# Patient Record
Sex: Male | Born: 1970 | Hispanic: No | Marital: Married | Smoking: Current every day smoker
Health system: Southern US, Community
[De-identification: ages and names within clinical notes are randomized; demographics above are authoritative.]

## PROBLEM LIST (undated history)

## (undated) DIAGNOSIS — E119 Type 2 diabetes mellitus without complications: Secondary | ICD-10-CM

## (undated) DIAGNOSIS — K219 Gastro-esophageal reflux disease without esophagitis: Secondary | ICD-10-CM

## (undated) DIAGNOSIS — Z87442 Personal history of urinary calculi: Secondary | ICD-10-CM

## (undated) DIAGNOSIS — Z720 Tobacco use: Secondary | ICD-10-CM

## (undated) DIAGNOSIS — E785 Hyperlipidemia, unspecified: Secondary | ICD-10-CM

## (undated) DIAGNOSIS — F32A Depression, unspecified: Secondary | ICD-10-CM

## (undated) DIAGNOSIS — I10 Essential (primary) hypertension: Secondary | ICD-10-CM

## (undated) DIAGNOSIS — G709 Myoneural disorder, unspecified: Secondary | ICD-10-CM

## (undated) HISTORY — DX: Tobacco use: Z72.0

## (undated) HISTORY — DX: Essential (primary) hypertension: I10

## (undated) HISTORY — PX: WISDOM TOOTH EXTRACTION: SHX21

## (undated) HISTORY — DX: Hyperlipidemia, unspecified: E78.5

---

## 2015-10-07 ENCOUNTER — Other Ambulatory Visit: Payer: Self-pay | Admitting: Neurology

## 2015-10-07 DIAGNOSIS — M5412 Radiculopathy, cervical region: Secondary | ICD-10-CM

## 2015-10-13 ENCOUNTER — Ambulatory Visit
Admission: RE | Admit: 2015-10-13 | Discharge: 2015-10-13 | Disposition: A | Discharge status: Home / Self Care | Payer: Medicaid Other | Source: Ambulatory Visit | Attending: Neurology | Admitting: Neurology

## 2015-10-13 DIAGNOSIS — M5412 Radiculopathy, cervical region: Secondary | ICD-10-CM

## 2018-09-12 ENCOUNTER — Encounter: Payer: Self-pay | Admitting: Cardiology

## 2018-09-12 ENCOUNTER — Ambulatory Visit: Payer: Medicaid Other | Admitting: Cardiology

## 2018-09-12 VITALS — BP 154/85 | HR 79 | Ht 66.0 in | Wt 214.0 lb

## 2018-09-12 DIAGNOSIS — R079 Chest pain, unspecified: Secondary | ICD-10-CM | POA: Diagnosis not present

## 2018-09-12 DIAGNOSIS — R0609 Other forms of dyspnea: Secondary | ICD-10-CM | POA: Diagnosis not present

## 2018-09-12 DIAGNOSIS — I10 Essential (primary) hypertension: Secondary | ICD-10-CM | POA: Diagnosis not present

## 2018-09-12 DIAGNOSIS — Z720 Tobacco use: Secondary | ICD-10-CM | POA: Insufficient documentation

## 2018-09-12 MED ORDER — NICOTINE 14 MG/24HR TD PT24: 14.0000 mg | MEDICATED_PATCH | TRANSDERMAL | 2 refills | Status: DC

## 2018-09-12 MED ORDER — ATORVASTATIN CALCIUM 40 MG PO TABS: 40.0000 mg | ORAL_TABLET | Freq: Every day | ORAL | 3 refills | Status: DC

## 2018-09-12 MED ORDER — NITROGLYCERIN 0.4 MG SL SUBL: 0.4000 mg | SUBLINGUAL_TABLET | SUBLINGUAL | 3 refills | Status: DC | PRN

## 2018-09-12 MED ORDER — ASPIRIN 81 MG PO CHEW: 81.0000 mg | CHEWABLE_TABLET | Freq: Every day | ORAL | 3 refills | Status: AC

## 2018-09-12 MED ORDER — ISOSORBIDE MONONITRATE ER 30 MG PO TB24: 30.0000 mg | ORAL_TABLET | Freq: Every day | ORAL | 2 refills | Status: DC

## 2018-09-12 NOTE — Progress Notes (Signed)
Patient referred by Benito Mccreedy, MD for chest pain  Subjective:   Randall Washington, male    DOB: Jul 09, 1971, 48 y.o.   MRN: [id number]  48 y.o. [place] male, here for evaluation of chest pain.   Chief Complaint  Patient presents with  . Chest Pain    NP Eval  . Shortness of Breath    HPI  48 year old [place] man here with his daughter today.  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  He moved from [place] to states about 7 years ago.  He is a 30-pack-year smoker and has hypertension and hyperlipidemia.  Patient reports left-sided exertional chest pain and shortness of breath that has worsened over the last few weeks.  He also reports chest heaviness that persists for a day or 2 after his episodes of chest pain.  He also endorses palpitations.  Denies orthopnea, PND, leg edema.   Past Medical History:  Diagnosis Date  . Hyperlipidemia   . Hypertension   . Tobacco abuse      History reviewed. No pertinent surgical history.   Social History   Socioeconomic History  . Marital status: Married    Spouse name: Not on file  . Number of children: 4  . Years of education: Not on file  . Highest education level: Not on file  Occupational History  . Not on file  Social Needs  . Financial resource strain: Not on file  . Food insecurity:    Worry: Not on file    Inability: Not on file  . Transportation needs:    Medical: Not on file    Non-medical: Not on file  Tobacco Use  . Smoking status: Current Every Day Smoker    Packs/day: 1.00    Years: 30.00    Pack years: 30.00    Types: Cigarettes  . Smokeless tobacco: Never Used  Substance and Sexual Activity  . Alcohol use: Never    Frequency: Never  . Drug use: Never  . Sexual activity: Not on file  Lifestyle  . Physical activity:    Days per week: Not on file    Minutes per session: Not on file  . Stress: Not on file  Relationships  . Social connections:    Talks on phone: Not on file   Gets together: Not on file    Attends religious service: Not on file    Active member of club or organization: Not on file    Attends meetings of clubs or organizations: Not on file    Relationship status: Not on file  . Intimate partner violence:    Fear of current or ex partner: Not on file    Emotionally abused: Not on file    Physically abused: Not on file    Forced sexual activity: Not on file  Other Topics Concern  . Not on file  Social History Narrative  . Not on file     Current Outpatient Medications on File Prior to Visit  Medication Sig Dispense Refill  . fenofibrate (TRICOR) 48 MG tablet Take 1 tablet by mouth daily.    Marland Kitchen lisinopril (PRINIVIL,ZESTRIL) 20 MG tablet Take 1 tablet by mouth daily.    . meloxicam (MOBIC) 15 MG tablet Take 15 mg by mouth daily.     No current facility-administered medications on file prior to visit.     Cardiovascular studies:  EKG 09/12/2018: Sinus  Rhythm  Possible old inferior infarct  Review of Systems  Constitution:  Negative for decreased appetite, malaise/fatigue, weight gain and weight loss.  HENT: Negative for congestion.   Eyes: Negative for visual disturbance.  Cardiovascular: Positive for chest pain, dyspnea on exertion and palpitations. Negative for leg swelling and syncope.  Respiratory: Negative for shortness of breath.   Endocrine: Negative for cold intolerance.  Hematologic/Lymphatic: Does not bruise/bleed easily.  Skin: Negative for itching and rash.  Musculoskeletal: Negative for myalgias.  Gastrointestinal: Negative for abdominal pain, nausea and vomiting.  Genitourinary: Negative for dysuria.  Neurological: Negative for dizziness and weakness.  Psychiatric/Behavioral: The patient is not nervous/anxious.   All other systems reviewed and are negative.        Vitals:   09/12/18 1311  BP: (!) 154/85  Pulse: 79  SpO2: 99%    Objective:   Physical Exam  Constitutional: He is oriented to person, place,  and time. He appears well-developed and well-nourished. No distress.  HENT:  Head: Normocephalic and atraumatic.  Eyes: Pupils are equal, round, and reactive to light. Conjunctivae are normal.  Neck: No JVD present.  Cardiovascular: Normal rate, regular rhythm and intact distal pulses.  No murmur heard. Pulmonary/Chest: Effort normal and breath sounds normal. He has no wheezes. He has no rales.  Abdominal: Soft. Bowel sounds are normal. There is no rebound.  Musculoskeletal:        General: No edema.  Lymphadenopathy:    He has no cervical adenopathy.  Neurological: He is alert and oriented to person, place, and time. No cranial nerve deficit.  Skin: Skin is warm and dry.  Psychiatric: He has a normal mood and affect.  Nursing note and vitals reviewed.         Assessment & Recommendations:   48 y/o [place] man with hypertension, hyperlipidemia, tobacco abuse, strong family h/o CAD, now with exertional chest pain and dyspnea  Exertional chest pain and dyspnea Symptoms are very concerning for angina. Recommend starting aspirin 81 mg, switch fenofibrate to high intensity statin-lipitor 40 mg daily. Also started Imdur 30 mg and as needed SL NTG. Recommend exercise nuclear stress test and echocardiogram. I have asked him to avoid physical exertion until further workup.  Essential hypertension Continue lisinopril 40 mg. Imdur added. Check CBC., BMP  Tobacco abuse Tobacco cessation counseling (CPT [phone]):  - Currently smoking 1 packs/day   - Patient was informed of the dangers of tobacco abuse including stroke, cancer, and MI, as well as benefits of tobacco cessation. - Patient is willing to quit at this time. - Approximately 5 mins were spent counseling patient cessation techniques. We discussed various methods to help quit smoking, including deciding on a date to quit, joining a support group, pharmacological agents- nicotine gum/patch/lozenges, chantix. Prescribed nicotine  patch - I will reassess his progress at the next follow-up visit  Hyperlipidemia: Switched fenofibrate to lipitor. Check lipid panel  I will see him after the above tests.   Thank you for referring the patient to [place]. Please feel free to contact with any questions.  Nigel Mormon, MD [facility] Pager: [phone] Office: [phone] If no answer Cell [phone]

## 2018-09-18 ENCOUNTER — Ambulatory Visit: Payer: Medicaid Other

## 2018-09-18 DIAGNOSIS — R0789 Other chest pain: Secondary | ICD-10-CM | POA: Diagnosis not present

## 2018-09-18 DIAGNOSIS — R0609 Other forms of dyspnea: Secondary | ICD-10-CM

## 2018-09-18 DIAGNOSIS — R079 Chest pain, unspecified: Secondary | ICD-10-CM

## 2018-09-29 ENCOUNTER — Ambulatory Visit: Payer: Medicaid Other

## 2018-09-29 DIAGNOSIS — R0609 Other forms of dyspnea: Secondary | ICD-10-CM

## 2018-09-29 DIAGNOSIS — R079 Chest pain, unspecified: Secondary | ICD-10-CM

## 2018-09-29 DIAGNOSIS — R0789 Other chest pain: Secondary | ICD-10-CM

## 2018-09-30 NOTE — Progress Notes (Signed)
Patient is here for follow up visit.  Subjective:   Randall Washington, male    DOB: Mar 23, 1971, 48 y.o.   MRN: [id number]   Chief Complaint  Patient presents with  . Chest Pain  . Follow-up    Echo and nuclear stress test    HPI  48 y/o Randall Washington man with hypertension, hyperlipidemia, tobacco abuse, strong family h/o CAD,seen for exertional chest pain and dyspnea.  He was started on aspirin 81 mg,  Imdur 30 mg, switched fenofibrate to high intensity statin-lipitor 40 mg daily. Workup showed excellent exercise capacity without evidence of ischemia or infarct. Echocardiogram showed mod LVH, EF 68, grade 1 DD, mild MR.  Patient is here with his daughter. His chest pain and shortness of breath symptoms have resolved. BP is improved, but still suboptimal. He still continues to smoke about 1 cigarettes/day.   Past Medical History:  Diagnosis Date  . Hyperlipidemia   . Hypertension   . Tobacco abuse      History reviewed. No pertinent surgical history.   Social History   Socioeconomic History  . Marital status: Married    Spouse name: Not on file  . Number of children: 4  . Years of education: Not on file  . Highest education level: Not on file  Occupational History  . Not on file  Social Needs  . Financial resource strain: Not on file  . Food insecurity:    Worry: Not on file    Inability: Not on file  . Transportation needs:    Medical: Not on file    Non-medical: Not on file  Tobacco Use  . Smoking status: Current Every Day Smoker    Packs/day: 1.00    Years: 30.00    Pack years: 30.00    Types: Cigarettes  . Smokeless tobacco: Never Used  Substance and Sexual Activity  . Alcohol use: Never    Frequency: Never  . Drug use: Never  . Sexual activity: Not on file  Lifestyle  . Physical activity:    Days per week: Not on file    Minutes per session: Not on file  . Stress: Not on file  Relationships  . Social connections:    Talks on phone: Not on file     Gets together: Not on file    Attends religious service: Not on file    Active member of club or organization: Not on file    Attends meetings of clubs or organizations: Not on file    Relationship status: Not on file  . Intimate partner violence:    Fear of current or ex partner: Not on file    Emotionally abused: Not on file    Physically abused: Not on file    Forced sexual activity: Not on file  Other Topics Concern  . Not on file  Social History Narrative  . Not on file     Current Outpatient Medications on File Prior to Visit  Medication Sig Dispense Refill  . amoxicillin (AMOXIL) 500 MG tablet TAKE 1 TABLET BY MOUTH EVERY 8 HOURS FOR 10 DAYS    . aspirin (ASPIRIN CHILDRENS) 81 MG chewable tablet Chew 1 tablet (81 mg total) by mouth daily. 90 tablet 3  . isosorbide mononitrate (IMDUR) 30 MG 24 hr tablet Take 1 tablet (30 mg total) by mouth daily. 30 tablet 2  . meloxicam (MOBIC) 15 MG tablet Take 15 mg by mouth continuous as needed.     . nitroGLYCERIN (NITROSTAT)  0.4 MG SL tablet Place 1 tablet (0.4 mg total) under the tongue every 5 (five) minutes as needed for chest pain. 30 tablet 3  . predniSONE (DELTASONE) 20 MG tablet TAKE 1 TABLET BY MOUTH ONCE DAILY FOR 5 DAYS     No current facility-administered medications on file prior to visit.     Cardiovascular studies:   Leane Call Stress Test 09/29/2018 1. Resting EKG demonstrates normal sinus rhythm.  Stress EKG is negative for myocardial ischemia.  Patient exercised for 09:51 minutes and achieved 11.55 METs.  Stress terminated due to 89% of MPHR achieved.  Normal blood pressure response. 2. Perfusion imaging study demonstrates mild diaphragmatic attenuation artifact.  Right ventricle appears prominent and may suggest RVH.  There is no evidence of ischemia.  Left ventricular systolic function was calculated at 45%, but visually appears to be normal. Low risk study.  Echocardiogram 09/18/2018: Left ventricle  cavity is normal in size. Moderate concentric hypertrophy of the left ventricle. Normal global wall motion. Doppler evidence of grade I (impaired) diastolic dysfunction, normal LAP. Calculated EF 68%. Mild (Grade I) mitral regurgitation. Inadequate TR jet to estimate pulmonary artery systolic pressure. Normal right atrial pressure.    Recent labs: Results for Randall Washington (MRN [id number]) as of 10/02/2018 17:40  Ref. Range 10/01/2018 33:35  BASIC METABOLIC PANEL Unknown Rpt (A)  Sodium Latest Ref Range: 134 - 144 mmol/L 142  Potassium Latest Ref Range: 3.5 - 5.2 mmol/L 4.8  Chloride Latest Ref Range: 96 - 106 mmol/L 108 (H)  CO2 Latest Ref Range: 20 - 29 mmol/L 22  Glucose Latest Ref Range: 65 - 99 mg/dL 118 (H)  BUN Latest Ref Range: 6 - 24 mg/dL 17  Creatinine Latest Ref Range: 0.76 - 1.27 mg/dL 0.77  Calcium Latest Ref Range: 8.7 - 10.2 mg/dL 9.7  BUN/Creatinine Ratio Latest Ref Range: 9 - 20  22 (H)  GFR, Est Non African Washington Latest Ref Range: >59 mL/min/1.73 108  GFR, Est African Washington Latest Ref Range: >59 mL/min/1.73 125  Total CHOL/HDL Ratio Latest Ref Range: 0.0 - 5.0 ratio 3.0  Cholesterol, Total Latest Ref Range: 100 - 199 mg/dL 132  HDL Cholesterol Latest Ref Range: >39 mg/dL 44  LDL (calc) Latest Ref Range: 0 - 99 mg/dL 65  Triglycerides Latest Ref Range: 0 - 149 mg/dL 114  VLDL Cholesterol Cal Latest Ref Range: 5 - 40 mg/dL 23   Results for Randall Washington (MRN [id number]) as of 10/02/2018 17:40  Ref. Range 10/01/2018 37:34  BASIC METABOLIC PANEL Unknown Rpt (A)  Sodium Latest Ref Range: 134 - 144 mmol/L 142  Potassium Latest Ref Range: 3.5 - 5.2 mmol/L 4.8  Chloride Latest Ref Range: 96 - 106 mmol/L 108 (H)  CO2 Latest Ref Range: 20 - 29 mmol/L 22  Glucose Latest Ref Range: 65 - 99 mg/dL 118 (H)  BUN Latest Ref Range: 6 - 24 mg/dL 17  Creatinine Latest Ref Range: 0.76 - 1.27 mg/dL 0.77  Calcium Latest Ref Range: 8.7 - 10.2 mg/dL 9.7  BUN/Creatinine Ratio Latest  Ref Range: 9 - 20  22 (H)  GFR, Est Non African Washington Latest Ref Range: >59 mL/min/1.73 108  GFR, Est African Washington Latest Ref Range: >59 mL/min/1.73 125  Total CHOL/HDL Ratio Latest Ref Range: 0.0 - 5.0 ratio 3.0  Cholesterol, Total Latest Ref Range: 100 - 199 mg/dL 132  HDL Cholesterol Latest Ref Range: >39 mg/dL 44  LDL (calc) Latest Ref Range: 0 - 99 mg/dL 65  Triglycerides Latest  Ref Range: 0 - 149 mg/dL 114  VLDL Cholesterol Cal Latest Ref Range: 5 - 40 mg/dL 23    Review of Systems  Constitution: Negative for decreased appetite, malaise/fatigue, weight gain and weight loss.  HENT: Negative for congestion.   Eyes: Negative for visual disturbance.  Cardiovascular: Positive for chest pain (Improved) and dyspnea on exertion (Improved). Negative for claudication, leg swelling, palpitations and syncope.  Respiratory: Negative for shortness of breath.   Endocrine: Negative for cold intolerance.  Hematologic/Lymphatic: Does not bruise/bleed easily.  Skin: Negative for itching and rash.  Musculoskeletal: Negative for myalgias.  Gastrointestinal: Negative for abdominal pain, nausea and vomiting.  Genitourinary: Negative for dysuria.  Neurological: Negative for dizziness and weakness.  Psychiatric/Behavioral: The patient is not nervous/anxious.   All other systems reviewed and are negative.      Objective:    Vitals:   10/03/18 0911  BP: (!) 144/88  Pulse: 80  SpO2: 96%     Physical Exam  Constitutional: He is oriented to person, place, and time. He appears well-developed and well-nourished. No distress.  HENT:  Head: Normocephalic and atraumatic.  Eyes: Pupils are equal, round, and reactive to light. Conjunctivae are normal.  Neck: No JVD present.  Cardiovascular: Normal rate, regular rhythm and intact distal pulses.  No murmur heard. Pulmonary/Chest: Effort normal and breath sounds normal. He has no wheezes. He has no rales.  Abdominal: Soft. Bowel sounds are  normal. There is no rebound.  Musculoskeletal:        General: No edema.  Lymphadenopathy:    He has no cervical adenopathy.  Neurological: He is alert and oriented to person, place, and time. No cranial nerve deficit.  Skin: Skin is warm and dry.  Psychiatric: He has a normal mood and affect.  Nursing note and vitals reviewed.       Assessment & Recommendations:   47 y/o Randall Washington man with hypertension, hyperlipidemia, tobacco abuse, strong family h/o CAD,seen for exertional chest pain and dyspnea.  Exertional chest pain & dyspnea: Reassuring workup. It is still possible that he has mild CAD-based on his classic description of angina., but improved with medical therapy. Continue medical therapy.   Essential hypertension Improved, but remains suboptimal. Increased lisinopril to 20 mg daily.   Hyperlipidemia: Lipid panel is fairly favorable. Reduced lipitor to 20 mg daily.   Tobacco abuse counseling: - Currently smoking 3/4 packs/day   - Patient was informed of the dangers of tobacco abuse including stroke, cancer, and MI, as well as benefits of tobacco cessation. - Patient is willing to quit at this time. - Approximately 5 mins were spent counseling patient cessation techniques. We discussed various methods to help quit smoking, including deciding on a date to quit, joining a support group, pharmacological agents- nicotine gum/patch/lozenges, chantix.  I re emphasized importance of tobacco cessation. I have given him a goal of quitting by his birthday on 12/05/2018. For now, continue nicotine patch and gum, as needed. I will see him bak on 12/04/2018 to further follow up on this.  - I will reassess his progress at the next follow-up visit   Nigel Mormon, MD [facility] Pager: [phone] Office: [phone] If no answer Cell [phone]

## 2018-10-02 LAB — BASIC METABOLIC PANEL
BUN/Creatinine Ratio: 22 — ABNORMAL HIGH (ref 9–20)
BUN: 17 mg/dL (ref 6–24)
CHLORIDE: 108 mmol/L — AB (ref 96–106)
CO2: 22 mmol/L (ref 20–29)
Calcium: 9.7 mg/dL (ref 8.7–10.2)
Creatinine, Ser: 0.77 mg/dL (ref 0.76–1.27)
GFR calc Af Amer: 125 mL/min/{1.73_m2} (ref >59)
GFR, EST NON AFRICAN AMERICAN: 108 mL/min/{1.73_m2} (ref >59)
Glucose: 118 mg/dL — ABNORMAL HIGH (ref 65–99)
POTASSIUM: 4.8 mmol/L (ref 3.5–5.2)
Sodium: 142 mmol/L (ref 134–144)

## 2018-10-02 LAB — LIPID PANEL
CHOL/HDL RATIO: 3 ratio (ref 0.0–5.0)
Cholesterol, Total: 132 mg/dL (ref 100–199)
HDL: 44 mg/dL (ref >39)
LDL Calculated: 65 mg/dL (ref 0–99)
Triglycerides: 114 mg/dL (ref 0–149)
VLDL Cholesterol Cal: 23 mg/dL (ref 5–40)

## 2018-10-02 LAB — CBC
Hematocrit: 42.8 % (ref 37.5–51.0)
Hemoglobin: 14.8 g/dL (ref 13.0–17.7)
MCH: 30.7 pg (ref 26.6–33.0)
MCHC: 34.6 g/dL (ref 31.5–35.7)
MCV: 89 fL (ref 79–97)
Platelets: 201 10*3/uL (ref 150–450)
RBC: 4.82 x10E6/uL (ref 4.14–5.80)
RDW: 12.7 % (ref 11.6–15.4)
WBC: 7.2 10*3/uL (ref 3.4–10.8)

## 2018-10-03 ENCOUNTER — Other Ambulatory Visit: Payer: Self-pay

## 2018-10-03 ENCOUNTER — Encounter: Payer: Self-pay | Admitting: Cardiology

## 2018-10-03 ENCOUNTER — Ambulatory Visit: Payer: Medicaid Other | Admitting: Cardiology

## 2018-10-03 VITALS — BP 144/88 | HR 80 | Ht 66.0 in | Wt 208.4 lb

## 2018-10-03 DIAGNOSIS — Z720 Tobacco use: Secondary | ICD-10-CM | POA: Diagnosis not present

## 2018-10-03 DIAGNOSIS — I10 Essential (primary) hypertension: Secondary | ICD-10-CM | POA: Diagnosis not present

## 2018-10-03 DIAGNOSIS — R079 Chest pain, unspecified: Secondary | ICD-10-CM | POA: Diagnosis not present

## 2018-10-03 MED ORDER — LISINOPRIL 40 MG PO TABS: 40.0000 mg | ORAL_TABLET | Freq: Every day | ORAL | 3 refills | Status: DC

## 2018-10-03 MED ORDER — NICOTINE POLACRILEX 2 MG MT GUM: 2.0000 mg | CHEWING_GUM | OROMUCOSAL | 3 refills | Status: DC | PRN

## 2018-10-03 MED ORDER — NICOTINE 14 MG/24HR TD PT24: 14.0000 mg | MEDICATED_PATCH | TRANSDERMAL | 1 refills | Status: AC

## 2018-10-03 MED ORDER — ATORVASTATIN CALCIUM 20 MG PO TABS: 20.0000 mg | ORAL_TABLET | Freq: Every day | ORAL | 3 refills | Status: DC

## 2018-10-06 ENCOUNTER — Ambulatory Visit: Payer: Medicaid Other | Admitting: Cardiology

## 2018-12-04 ENCOUNTER — Ambulatory Visit: Payer: Medicaid Other | Admitting: Cardiology

## 2020-12-07 ENCOUNTER — Encounter (HOSPITAL_BASED_OUTPATIENT_CLINIC_OR_DEPARTMENT_OTHER): Payer: Self-pay | Admitting: Emergency Medicine

## 2020-12-07 ENCOUNTER — Inpatient Hospital Stay (HOSPITAL_COMMUNITY): Payer: Medicaid Other

## 2020-12-07 ENCOUNTER — Other Ambulatory Visit: Payer: Self-pay

## 2020-12-07 ENCOUNTER — Emergency Department (HOSPITAL_BASED_OUTPATIENT_CLINIC_OR_DEPARTMENT_OTHER): Payer: Medicaid Other

## 2020-12-07 ENCOUNTER — Inpatient Hospital Stay (HOSPITAL_BASED_OUTPATIENT_CLINIC_OR_DEPARTMENT_OTHER)
Admission: EM | Admit: 2020-12-07 | Discharge: 2020-12-09 | DRG: 246 | Disposition: A | Discharge status: Home / Self Care | Payer: Medicaid Other | Attending: Internal Medicine | Admitting: Internal Medicine

## 2020-12-07 ENCOUNTER — Inpatient Hospital Stay (HOSPITAL_COMMUNITY)
Admission: EM | Disposition: A | Discharge status: Home / Self Care | Payer: Self-pay | Source: Home / Self Care | Attending: Internal Medicine

## 2020-12-07 DIAGNOSIS — I2582 Chronic total occlusion of coronary artery: Secondary | ICD-10-CM | POA: Diagnosis present

## 2020-12-07 DIAGNOSIS — I2510 Atherosclerotic heart disease of native coronary artery without angina pectoris: Secondary | ICD-10-CM | POA: Diagnosis present

## 2020-12-07 DIAGNOSIS — E669 Obesity, unspecified: Secondary | ICD-10-CM | POA: Diagnosis present

## 2020-12-07 DIAGNOSIS — Z6832 Body mass index (BMI) 32.0-32.9, adult: Secondary | ICD-10-CM | POA: Diagnosis not present

## 2020-12-07 DIAGNOSIS — I249 Acute ischemic heart disease, unspecified: Principal | ICD-10-CM

## 2020-12-07 DIAGNOSIS — I10 Essential (primary) hypertension: Secondary | ICD-10-CM | POA: Diagnosis present

## 2020-12-07 DIAGNOSIS — F17210 Nicotine dependence, cigarettes, uncomplicated: Secondary | ICD-10-CM | POA: Diagnosis present

## 2020-12-07 DIAGNOSIS — M549 Dorsalgia, unspecified: Secondary | ICD-10-CM | POA: Diagnosis present

## 2020-12-07 DIAGNOSIS — G8929 Other chronic pain: Secondary | ICD-10-CM | POA: Diagnosis present

## 2020-12-07 DIAGNOSIS — I214 Non-ST elevation (NSTEMI) myocardial infarction: Principal | ICD-10-CM | POA: Diagnosis present

## 2020-12-07 DIAGNOSIS — Z8249 Family history of ischemic heart disease and other diseases of the circulatory system: Secondary | ICD-10-CM

## 2020-12-07 DIAGNOSIS — U071 COVID-19: Secondary | ICD-10-CM | POA: Diagnosis present

## 2020-12-07 DIAGNOSIS — I493 Ventricular premature depolarization: Secondary | ICD-10-CM | POA: Diagnosis present

## 2020-12-07 DIAGNOSIS — R079 Chest pain, unspecified: Secondary | ICD-10-CM

## 2020-12-07 DIAGNOSIS — E785 Hyperlipidemia, unspecified: Secondary | ICD-10-CM | POA: Diagnosis present

## 2020-12-07 HISTORY — PX: LEFT HEART CATH AND CORONARY ANGIOGRAPHY: CATH118249

## 2020-12-07 HISTORY — DX: Myoneural disorder, unspecified: G70.9

## 2020-12-07 HISTORY — DX: Depression, unspecified: F32.A

## 2020-12-07 HISTORY — DX: Gastro-esophageal reflux disease without esophagitis: K21.9

## 2020-12-07 LAB — COMPREHENSIVE METABOLIC PANEL
ALT: 30 U/L (ref 0–44)
AST: 38 U/L (ref 15–41)
Albumin: 4.4 g/dL (ref 3.5–5.0)
Alkaline Phosphatase: 52 U/L (ref 38–126)
Anion gap: 9 (ref 5–15)
BUN: 12 mg/dL (ref 6–20)
CO2: 23 mmol/L (ref 22–32)
Calcium: 8.9 mg/dL (ref 8.9–10.3)
Chloride: 105 mmol/L (ref 98–111)
Creatinine, Ser: 0.7 mg/dL (ref 0.61–1.24)
GFR, Estimated: >60 mL/min (ref >60)
Glucose, Bld: 122 mg/dL — ABNORMAL HIGH (ref 70–99)
Potassium: 4.1 mmol/L (ref 3.5–5.1)
Sodium: 137 mmol/L (ref 135–145)
Total Bilirubin: 0.5 mg/dL (ref 0.3–1.2)
Total Protein: 7.6 g/dL (ref 6.5–8.1)

## 2020-12-07 LAB — ECHOCARDIOGRAM COMPLETE
Area-P 1/2: 3.03 cm2
Height: 66 in
S' Lateral: 3.1 cm
Weight: 3184 oz

## 2020-12-07 LAB — CBC WITH DIFFERENTIAL/PLATELET
Abs Immature Granulocytes: 0.02 10*3/uL (ref 0.00–0.07)
Basophils Absolute: 0 10*3/uL (ref 0.0–0.1)
Basophils Relative: 1 %
Eosinophils Absolute: 0 10*3/uL (ref 0.0–0.5)
Eosinophils Relative: 1 %
HCT: 47.8 % (ref 39.0–52.0)
Hemoglobin: 16.9 g/dL (ref 13.0–17.0)
Immature Granulocytes: 0 %
Lymphocytes Relative: 35 %
Lymphs Abs: 2.3 10*3/uL (ref 0.7–4.0)
MCH: 30.6 pg (ref 26.0–34.0)
MCHC: 35.4 g/dL (ref 30.0–36.0)
MCV: 86.4 fL (ref 80.0–100.0)
Monocytes Absolute: 0.9 10*3/uL (ref 0.1–1.0)
Monocytes Relative: 13 %
Neutro Abs: 3.3 10*3/uL (ref 1.7–7.7)
Neutrophils Relative %: 50 %
Platelets: 173 10*3/uL (ref 150–400)
RBC: 5.53 MIL/uL (ref 4.22–5.81)
RDW: 12.1 % (ref 11.5–15.5)
WBC: 6.5 10*3/uL (ref 4.0–10.5)
nRBC: 0 % (ref 0.0–0.2)

## 2020-12-07 LAB — D-DIMER, QUANTITATIVE: D-Dimer, Quant: 0.36 ug/mL-FEU (ref 0.00–0.50)

## 2020-12-07 LAB — TROPONIN I (HIGH SENSITIVITY)
Troponin I (High Sensitivity): 891 ng/L (ref <18)
Troponin I (High Sensitivity): 3861 ng/L (ref <18)

## 2020-12-07 LAB — PROCALCITONIN: Procalcitonin: <0.1 ng/mL

## 2020-12-07 LAB — HEMOGLOBIN A1C
Hgb A1c MFr Bld: 6.1 % — ABNORMAL HIGH (ref 4.8–5.6)
Mean Plasma Glucose: 128.37 mg/dL

## 2020-12-07 LAB — LIPID PANEL
Cholesterol: 155 mg/dL (ref 0–200)
HDL: 43 mg/dL (ref >40)
LDL Cholesterol: 98 mg/dL (ref 0–99)
Total CHOL/HDL Ratio: 3.6 RATIO
Triglycerides: 69 mg/dL (ref <150)
VLDL: 14 mg/dL (ref 0–40)

## 2020-12-07 LAB — FIBRINOGEN: Fibrinogen: 322 mg/dL (ref 210–475)

## 2020-12-07 LAB — FERRITIN: Ferritin: 290 ng/mL (ref 24–336)

## 2020-12-07 LAB — LACTATE DEHYDROGENASE: LDH: 333 U/L — ABNORMAL HIGH (ref 98–192)

## 2020-12-07 LAB — HIV ANTIBODY (ROUTINE TESTING W REFLEX): HIV Screen 4th Generation wRfx: NONREACTIVE

## 2020-12-07 LAB — RESP PANEL BY RT-PCR (FLU A&B, COVID) ARPGX2
Influenza A by PCR: NEGATIVE
Influenza B by PCR: NEGATIVE
SARS Coronavirus 2 by RT PCR: POSITIVE — AB

## 2020-12-07 LAB — HEPATITIS B SURFACE ANTIGEN: Hepatitis B Surface Ag: NONREACTIVE

## 2020-12-07 LAB — HEPARIN LEVEL (UNFRACTIONATED): Heparin Unfractionated: 0.13 IU/mL — ABNORMAL LOW (ref 0.30–0.70)

## 2020-12-07 SURGERY — LEFT HEART CATH AND CORONARY ANGIOGRAPHY
Anesthesia: LOCAL

## 2020-12-07 MED ORDER — ASPIRIN 81 MG PO CHEW
324.0000 mg | CHEWABLE_TABLET | Freq: Once | ORAL | Status: AC
  Administered 2020-12-07: 324 mg via ORAL
  Filled 2020-12-07: qty 4

## 2020-12-07 MED ORDER — SODIUM CHLORIDE 0.9 % WEIGHT BASED INFUSION: 1.0000 mL/kg/h | INTRAVENOUS | Status: DC

## 2020-12-07 MED ORDER — VERAPAMIL HCL 2.5 MG/ML IV SOLN
INTRAVENOUS | Status: DC | PRN
  Administered 2020-12-07: 10 mL via INTRA_ARTERIAL

## 2020-12-07 MED ORDER — SODIUM CHLORIDE 0.9 % WEIGHT BASED INFUSION: 3.0000 mL/kg/h | INTRAVENOUS | Status: DC

## 2020-12-07 MED ORDER — LIDOCAINE HCL (PF) 1 % IJ SOLN
INTRAMUSCULAR | Status: DC | PRN
  Administered 2020-12-07: 2 mL

## 2020-12-07 MED ORDER — HEPARIN (PORCINE) IN NACL 1000-0.9 UT/500ML-% IV SOLN
INTRAVENOUS | Status: DC | PRN
  Administered 2020-12-07 (×3): 500 mL

## 2020-12-07 MED ORDER — HYDRALAZINE HCL 20 MG/ML IJ SOLN: 10.0000 mg | INTRAMUSCULAR | Status: AC | PRN

## 2020-12-07 MED ORDER — CLOPIDOGREL BISULFATE 300 MG PO TABS
ORAL_TABLET | ORAL | Status: AC
  Filled 2020-12-07: qty 1

## 2020-12-07 MED ORDER — NITROGLYCERIN IN D5W 200-5 MCG/ML-% IV SOLN
0.0000 ug/min | INTRAVENOUS | Status: DC
  Administered 2020-12-07: 5 ug/min via INTRAVENOUS
  Filled 2020-12-07: qty 250

## 2020-12-07 MED ORDER — NICOTINE 21 MG/24HR TD PT24
21.0000 mg | MEDICATED_PATCH | Freq: Every day | TRANSDERMAL | Status: DC
  Administered 2020-12-07 – 2020-12-09 (×3): 21 mg via TRANSDERMAL
  Filled 2020-12-07 (×3): qty 1

## 2020-12-07 MED ORDER — SODIUM CHLORIDE 0.9% FLUSH: 3.0000 mL | INTRAVENOUS | Status: DC | PRN

## 2020-12-07 MED ORDER — SODIUM CHLORIDE 0.9% FLUSH
3.0000 mL | Freq: Two times a day (BID) | INTRAVENOUS | Status: DC
  Administered 2020-12-09: 3 mL via INTRAVENOUS

## 2020-12-07 MED ORDER — MIDAZOLAM HCL 2 MG/2ML IJ SOLN
INTRAMUSCULAR | Status: AC
  Filled 2020-12-07: qty 2

## 2020-12-07 MED ORDER — LIDOCAINE HCL (PF) 1 % IJ SOLN
INTRAMUSCULAR | Status: AC
  Filled 2020-12-07: qty 30

## 2020-12-07 MED ORDER — FENTANYL CITRATE (PF) 100 MCG/2ML IJ SOLN
INTRAMUSCULAR | Status: AC
  Filled 2020-12-07: qty 2

## 2020-12-07 MED ORDER — SODIUM CHLORIDE 0.9 % IV SOLN: 250.0000 mL | INTRAVENOUS | Status: DC | PRN

## 2020-12-07 MED ORDER — ASPIRIN 300 MG RE SUPP
300.0000 mg | RECTAL | Status: AC
  Filled 2020-12-07: qty 1

## 2020-12-07 MED ORDER — ACETAMINOPHEN 325 MG PO TABS
650.0000 mg | ORAL_TABLET | ORAL | Status: DC | PRN
  Administered 2020-12-08: 650 mg via ORAL
  Filled 2020-12-07: qty 2

## 2020-12-07 MED ORDER — FENTANYL CITRATE (PF) 100 MCG/2ML IJ SOLN
INTRAMUSCULAR | Status: DC | PRN
  Administered 2020-12-07: 50 ug via INTRAVENOUS

## 2020-12-07 MED ORDER — TICAGRELOR 90 MG PO TABS
ORAL_TABLET | ORAL | Status: AC
  Filled 2020-12-07: qty 1

## 2020-12-07 MED ORDER — LABETALOL HCL 5 MG/ML IV SOLN: 10.0000 mg | INTRAVENOUS | Status: AC | PRN

## 2020-12-07 MED ORDER — ZOLPIDEM TARTRATE 5 MG PO TABS
5.0000 mg | ORAL_TABLET | Freq: Every evening | ORAL | Status: DC | PRN
  Administered 2020-12-07 – 2020-12-08 (×2): 5 mg via ORAL
  Filled 2020-12-07 (×2): qty 1

## 2020-12-07 MED ORDER — HEPARIN (PORCINE) 25000 UT/250ML-% IV SOLN
1550.0000 [IU]/h | INTRAVENOUS | Status: DC
  Administered 2020-12-08: 1350 [IU]/h via INTRAVENOUS
  Filled 2020-12-07: qty 250

## 2020-12-07 MED ORDER — HEPARIN BOLUS VIA INFUSION
4000.0000 [IU] | Freq: Once | INTRAVENOUS | Status: AC
  Administered 2020-12-07: 4000 [IU] via INTRAVENOUS

## 2020-12-07 MED ORDER — VERAPAMIL HCL 2.5 MG/ML IV SOLN
INTRAVENOUS | Status: AC
  Filled 2020-12-07: qty 2

## 2020-12-07 MED ORDER — ONDANSETRON HCL 4 MG/2ML IJ SOLN: 4.0000 mg | Freq: Four times a day (QID) | INTRAMUSCULAR | Status: DC | PRN

## 2020-12-07 MED ORDER — ASPIRIN EC 81 MG PO TBEC
81.0000 mg | DELAYED_RELEASE_TABLET | Freq: Every day | ORAL | Status: DC
  Administered 2020-12-08 – 2020-12-09 (×2): 81 mg via ORAL
  Filled 2020-12-07 (×2): qty 1

## 2020-12-07 MED ORDER — SODIUM CHLORIDE 0.9 % WEIGHT BASED INFUSION
3.0000 mL/kg/h | INTRAVENOUS | Status: DC
  Administered 2020-12-07: 3 mL/kg/h via INTRAVENOUS

## 2020-12-07 MED ORDER — SODIUM CHLORIDE 0.9% FLUSH
3.0000 mL | INTRAVENOUS | Status: DC | PRN
Start: 2020-12-07 — End: 2020-12-09

## 2020-12-07 MED ORDER — TIZANIDINE HCL 4 MG PO TABS
4.0000 mg | ORAL_TABLET | Freq: Three times a day (TID) | ORAL | Status: DC | PRN
  Administered 2020-12-09: 4 mg via ORAL
  Filled 2020-12-07 (×2): qty 1

## 2020-12-07 MED ORDER — IOHEXOL 350 MG/ML SOLN
INTRAVENOUS | Status: DC | PRN
  Administered 2020-12-07: 100 mL

## 2020-12-07 MED ORDER — HEPARIN (PORCINE) IN NACL 1000-0.9 UT/500ML-% IV SOLN
INTRAVENOUS | Status: AC
  Filled 2020-12-07: qty 500

## 2020-12-07 MED ORDER — VERAPAMIL HCL 2.5 MG/ML IV SOLN
INTRAVENOUS | Status: DC | PRN
  Administered 2020-12-07: 2.5 mg via INTRA_ARTERIAL

## 2020-12-07 MED ORDER — FENOFIBRATE 160 MG PO TABS
160.0000 mg | ORAL_TABLET | Freq: Every day | ORAL | Status: DC
  Administered 2020-12-08 – 2020-12-09 (×2): 160 mg via ORAL
  Filled 2020-12-07 (×2): qty 1

## 2020-12-07 MED ORDER — HEPARIN BOLUS VIA INFUSION
3000.0000 [IU] | Freq: Once | INTRAVENOUS | Status: DC
  Filled 2020-12-07: qty 3000

## 2020-12-07 MED ORDER — NITROGLYCERIN 0.4 MG SL SUBL: 0.4000 mg | SUBLINGUAL_TABLET | SUBLINGUAL | Status: DC | PRN

## 2020-12-07 MED ORDER — TICAGRELOR 90 MG PO TABS
90.0000 mg | ORAL_TABLET | Freq: Two times a day (BID) | ORAL | Status: DC
  Administered 2020-12-08 – 2020-12-09 (×3): 90 mg via ORAL
  Filled 2020-12-07 (×3): qty 1

## 2020-12-07 MED ORDER — MELOXICAM 7.5 MG PO TABS
15.0000 mg | ORAL_TABLET | Freq: Every day | ORAL | Status: DC
  Administered 2020-12-08: 15 mg via ORAL
  Filled 2020-12-07 (×2): qty 2

## 2020-12-07 MED ORDER — ACETAMINOPHEN 325 MG PO TABS: 650.0000 mg | ORAL_TABLET | ORAL | Status: DC | PRN

## 2020-12-07 MED ORDER — MIDAZOLAM HCL 2 MG/2ML IJ SOLN
INTRAMUSCULAR | Status: DC | PRN
  Administered 2020-12-07: 1 mg via INTRAVENOUS

## 2020-12-07 MED ORDER — HEPARIN (PORCINE) 25000 UT/250ML-% IV SOLN
1350.0000 [IU]/h | INTRAVENOUS | Status: DC
  Administered 2020-12-07: 1100 [IU]/h via INTRAVENOUS
  Filled 2020-12-07: qty 250

## 2020-12-07 MED ORDER — SODIUM CHLORIDE 0.9 % IV SOLN: INTRAVENOUS | Status: AC

## 2020-12-07 MED ORDER — SODIUM CHLORIDE 0.9% FLUSH
3.0000 mL | Freq: Two times a day (BID) | INTRAVENOUS | Status: DC
  Administered 2020-12-07: 3 mL via INTRAVENOUS

## 2020-12-07 MED ORDER — HEPARIN SODIUM (PORCINE) 1000 UNIT/ML IJ SOLN
INTRAMUSCULAR | Status: DC | PRN
  Administered 2020-12-07: 3000 [IU] via INTRAVENOUS
  Administered 2020-12-07: 6000 [IU] via INTRAVENOUS
  Administered 2020-12-07: 3000 [IU] via INTRAVENOUS

## 2020-12-07 MED ORDER — ALUM & MAG HYDROXIDE-SIMETH 200-200-20 MG/5ML PO SUSP
30.0000 mL | Freq: Once | ORAL | Status: AC
  Administered 2020-12-07: 30 mL via ORAL
  Filled 2020-12-07: qty 30

## 2020-12-07 MED ORDER — ASPIRIN 81 MG PO CHEW: 81.0000 mg | CHEWABLE_TABLET | ORAL | Status: DC

## 2020-12-07 MED ORDER — ASPIRIN 81 MG PO CHEW
324.0000 mg | CHEWABLE_TABLET | ORAL | Status: AC
  Administered 2020-12-07: 324 mg via ORAL
  Filled 2020-12-07: qty 4

## 2020-12-07 MED ORDER — TICAGRELOR 90 MG PO TABS
ORAL_TABLET | ORAL | Status: DC | PRN
  Administered 2020-12-07: 180 mg via ORAL

## 2020-12-07 MED ORDER — PREDNISONE 20 MG PO TABS
20.0000 mg | ORAL_TABLET | Freq: Every day | ORAL | Status: DC
  Administered 2020-12-08 – 2020-12-09 (×2): 20 mg via ORAL
  Filled 2020-12-07 (×2): qty 1

## 2020-12-07 SURGICAL SUPPLY — 20 items
CATH INFINITI 6F AL1 (CATHETERS) ×1 IMPLANT
CATH LAUNCHER 6FR EBU 3 (CATHETERS) ×1 IMPLANT
CATH LAUNCHER 6FR JL3 (CATHETERS) ×1 IMPLANT
CATH OPTITORQUE TIG 4.0 5F (CATHETERS) ×1 IMPLANT
CATH VISTA GUIDE 6FR JR4 (CATHETERS) ×1 IMPLANT
DEVICE RAD COMP TR BAND LRG (VASCULAR PRODUCTS) ×1 IMPLANT
ELECT DEFIB PAD ADLT CADENCE (PAD) ×1 IMPLANT
GLIDESHEATH SLEND A-KIT 6F 22G (SHEATH) ×1 IMPLANT
GUIDEWIRE INQWIRE 1.5J.035X260 (WIRE) IMPLANT
INQWIRE 1.5J .035X260CM (WIRE) ×2
KIT ESSENTIALS PG (KITS) ×1 IMPLANT
KIT HEART LEFT (KITS) ×2 IMPLANT
PACK CARDIAC CATHETERIZATION (CUSTOM PROCEDURE TRAY) ×2 IMPLANT
SHEATH PROBE COVER 6X72 (BAG) ×1 IMPLANT
TRANSDUCER W/STOPCOCK (MISCELLANEOUS) ×2 IMPLANT
TUBING CIL FLEX 10 FLL-RA (TUBING) ×2 IMPLANT
VALVE COPILOT STAT (MISCELLANEOUS) ×1 IMPLANT
WIRE ASAHI FIELDER XT 190CM (WIRE) ×1 IMPLANT
WIRE COUGAR XT STRL 190CM (WIRE) ×1 IMPLANT
WIRE HI TORQ WHISPER MS 190CM (WIRE) ×1 IMPLANT

## 2020-12-07 NOTE — H&P (Signed)
History and Physical    Randall Washington TDV:[id number] DOB: 04-Dec-1970 DOA: 12/07/2020  PCP: Benito Mccreedy, MD (Confirm with patient/family/NH records and if not entered, this has to be entered at [facility] point of entry) Patient coming from: Home  I have personally briefly reviewed patient's old medical records in [place]  Chief Complaint: Chest pain  HPI: Randall Washington is a 50 y.o. male with medical history significant of HTN, HLD, chronic back pain, obesity, GERD, presented with new onset of chest pain.  Patient was feeling tired 3-4 days ago, went to see PCP, who rounded COVID test and was positive.  And patient was not having any of the symptoms such as cough, fever, shortness of breath, loss of taste or diarrhea.  PCP started patient on Zithromax and prednisone 20 mg daily since yesterday.  Yesterday evening, after dinner, patient started to feel burning-like chest pain, retrosternal, localized, 8/10, denied any associated symptoms of shortness of breath sweating of feeling nausea.  Symptoms lasted about 3 to 4 hours, family gave her some Tylenol, which had minimum effect.  Patient was vaccinated for COVID x2. ED Course: Trop [phone], EKG no significant ST-T changes.  Chest x-ray no acute infiltrate.  Vital signs stable no hypoxia.  Review of Systems: As per HPI otherwise 14 point review of systems negative.    Past Medical History:  Diagnosis Date  . Depression   . GERD (gastroesophageal reflux disease)   . Hyperlipidemia   . Hypertension   . Neuromuscular disorder (HCC)    neck and shoulder pain right side down to fingers and numbess, steriod shots  . Tobacco abuse     Past Surgical History:  Procedure Laterality Date  . WISDOM TOOTH EXTRACTION       reports that he has been smoking cigarettes. He has a 30.00 pack-year smoking history. He has never used smokeless tobacco. He reports that he does not drink alcohol and does not use drugs.  No Known  Allergies  Family History  Problem Relation Age of Onset  . Heart disease Father   . Heart attack Father 4  . Heart disease Brother   . Heart disease Brother   . Heart disease Brother   . Heart disease Brother      Prior to Admission medications   Medication Sig Start Date End Date Taking? Authorizing Provider  meloxicam (MOBIC) 15 MG tablet 1 tab(s) 09/05/20  Yes [provider]  predniSONE (DELTASONE) 20 MG tablet 1 tab(s) 12/05/20  Yes [provider]  fenofibrate (TRICOR) 48 MG tablet Take 96 mg by mouth daily. 11/30/20   [provider]  ketoconazole (NIZORAL) 2 % shampoo Apply topically. 11/23/20   [provider]  losartan (COZAAR) 100 MG tablet Take 1 tablet by mouth daily. 11/29/20   [provider]  tiZANidine (ZANAFLEX) 4 MG tablet Take 4 mg by mouth every 8 (eight) hours as needed. 11/23/20   [provider]    Physical Exam: Vitals:   12/07/20 0900 12/07/20 0930 12/07/20 1000 12/07/20 1030  BP: 139/85 140/74 126/84 120/86  Pulse: 75 (!) 58 63 60  Resp: 16 16 15 16   Temp:    98 F (36.7 C)  TempSrc:    Oral  SpO2: 98% 98% 100% 100%  Weight:      Height:        Constitutional: NAD, calm, comfortable Vitals:   12/07/20 0900 12/07/20 0930 12/07/20 1000 12/07/20 1030  BP: 139/85 140/74 126/84 120/86  Pulse: 75 (!)  58 63 60  Resp: 16 16 15 16   Temp:    98 F (36.7 C)  TempSrc:    Oral  SpO2: 98% 98% 100% 100%  Weight:      Height:       Eyes: PERRL, lids and conjunctivae normal ENMT: Mucous membranes are moist. Posterior pharynx clear of any exudate or lesions.Normal dentition.  Neck: normal, supple, no masses, no thyromegaly Respiratory: clear to auscultation bilaterally, no wheezing, no crackles. Normal respiratory effort. No accessory muscle use.  Cardiovascular: Regular rate and rhythm, no murmurs / rubs / gallops. No extremity edema. 2+ pedal pulses. No carotid bruits.  Abdomen: no tenderness, no masses  palpated. No hepatosplenomegaly. Bowel sounds positive.  Musculoskeletal: no clubbing / cyanosis. No joint deformity upper and lower extremities. Good ROM, no contractures. Normal muscle tone.  Skin: no rashes, lesions, ulcers. No induration Neurologic: CN 2-12 grossly intact. Sensation intact, DTR normal. Strength 5/5 in all 4.  Psychiatric: Normal judgment and insight. Alert and oriented x 3. Normal mood.     Labs on Admission: I have personally reviewed following labs and imaging studies  CBC: Recent Labs  Lab 12/07/20 0416  WBC 6.5  NEUTROABS 3.3  HGB 16.9  HCT 47.8  MCV 86.4  PLT 485   Basic Metabolic Panel: Recent Labs  Lab 12/07/20 0416  NA 137  K 4.1  CL 105  CO2 23  GLUCOSE 122*  BUN 12  CREATININE 0.70  CALCIUM 8.9   GFR: Estimated Creatinine Clearance: 116.3 mL/min (by C-G formula based on SCr of 0.7 mg/dL). Liver Function Tests: Recent Labs  Lab 12/07/20 0416  AST 38  ALT 30  ALKPHOS 52  BILITOT 0.5  PROT 7.6  ALBUMIN 4.4   No results for input(s): LIPASE, AMYLASE in the last 168 hours. No results for input(s): AMMONIA in the last 168 hours. Coagulation Profile: No results for input(s): INR, PROTIME in the last 168 hours. Cardiac Enzymes: No results for input(s): CKTOTAL, CKMB, CKMBINDEX, TROPONINI in the last 168 hours. BNP (last 3 results) No results for input(s): PROBNP in the last 8760 hours. HbA1C: No results for input(s): HGBA1C in the last 72 hours. CBG: No results for input(s): GLUCAP in the last 168 hours. Lipid Profile: No results for input(s): CHOL, HDL, LDLCALC, TRIG, CHOLHDL, LDLDIRECT in the last 72 hours. Thyroid Function Tests: No results for input(s): TSH, T4TOTAL, FREET4, T3FREE, THYROIDAB in the last 72 hours. Anemia Panel: No results for input(s): VITAMINB12, FOLATE, FERRITIN, TIBC, IRON, RETICCTPCT in the last 72 hours. Urine analysis: No results found for: COLORURINE, APPEARANCEUR, LABSPEC, PHURINE, GLUCOSEU, HGBUR,  BILIRUBINUR, KETONESUR, PROTEINUR, UROBILINOGEN, NITRITE, LEUKOCYTESUR  Radiological Exams on Admission: DG Chest 1 View  Result Date: 12/07/2020 CLINICAL DATA:  50 year old male with chest pain. EXAM: CHEST  1 VIEW COMPARISON:  None. FINDINGS: Portable AP view at 0505 hours. Normal lung volumes and mediastinal contours. Visualized tracheal air column is within normal limits. Allowing for portable technique the lungs are clear. No pneumothorax. No osseous abnormality identified. Negative visible bowel gas pattern. IMPRESSION: Negative portable chest. Electronically Signed   By: Genevie Ann M.D.   On: 12/07/2020 05:13    EKG: Independently reviewed.  Slight ST elevation less than 0.5 mm on III and aVF.  Assessment/Plan Active Problems:   NSTEMI (non-ST elevated myocardial infarction) ([place])   COVID-19 virus infection  (please populate well all problems here in Problem List. (For example, if patient is on BP meds at home and you resume  or decide to hold them, it is a problem that needs to be her. Same for CAD, COPD, HLD and so on)  NSTEMI -Has risk factor, hyper pressure, HLD, and, active smoking. -Discussed with cardiology, who plans for cardiac cath this afternoon. -NPO. -Dual antiplatelet therapy and ACS meds, no beta blocker for borderline bradycardia.  COVID infection -No respiratory symptoms. -Check COVID markers. -D/C Zithromax  Chronic back pain -Continue short course of prednisone.  Elevated glucose -Check A1C. -May have metabolic syndrome  Obesity -Outpatient PCP follow-up  Cigarette smoke -Educated to quit -Nicotine patch  DVT prophylaxis: Heparin drip Code Status: Full Code Family Communication: Daughter at bedside Disposition Plan: Expect 1-2 days of hospital stay Consults called: Cardiology Admission status: Tele admit   Lequita Halt MD [facility] Pager [phone]  12/07/2020, 1:01 PM

## 2020-12-07 NOTE — Progress Notes (Addendum)
50 y/o [place] man with hypertension, hyperlipidemia, tobacco abuse, strong family h/o CAD, presented to [facility] with retrosternal chest pain that started after dinner on 12/06/20 night. EKG with minimal inferior ST elevation, not meeting STEMI criteria. Trop HS 800-->5000. He is also incidentally COVID positive, first noted on Sunday 12/04/20 at outside facility. Chest Xray clear. No fever.   Recommend admission to hospitalist service. Recommend Aspirin 81 mg, Crestor 40 mg, IV heparin, IV nitroglycerin. Hold losartan. Workup and management for any COVID related illness as per primary team. Recommend echocardiogram. Will plan to perform cath with COVID precautions later this afternoon.  I spoke with the patient over the phone, with the help of his sister Rovaida who assisted with interpretation.   Nigel Mormon, MD Pager: [phone] Office: [phone]

## 2020-12-07 NOTE — ED Notes (Signed)
Pt pain free at this time.  New order for nitro gtt initiated as ordered per unit RN request.

## 2020-12-07 NOTE — Progress Notes (Signed)
[place] for IV Heparin Indication: chest pain/ACS  No Known Allergies  Patient Measurements: Height: 5\' 6"  (167.6 cm) Weight: 90.3 kg (199 lb) IBW/kg (Calculated) : 63.8 Heparin Dosing Weight: 82.9 kg  Vital Signs: Temp: 98 F (36.7 C) (05/18 1030) Temp Source: Oral (05/18 1030) BP: 124/76 (05/18 1723) Pulse Rate: 58 (05/18 1723)  Labs: Recent Labs    12/07/20 0416 12/07/20 0618 12/07/20 1207  HGB 16.9  --   --   HCT 47.8  --   --   PLT 173  --   --   HEPARINUNFRC  --   --  0.13*  CREATININE 0.70  --   --   TROPONINIHS 891* 3,861*  --     Estimated Creatinine Clearance: 116.3 mL/min (by C-G formula based on SCr of 0.7 mg/dL).   Medical History: Past Medical History:  Diagnosis Date  . Depression   . GERD (gastroesophageal reflux disease)   . Hyperlipidemia   . Hypertension   . Neuromuscular disorder (HCC)    neck and shoulder pain right side down to fingers and numbess, steriod shots  . Tobacco abuse     Assessment: 50 yr old man c/o left-sided CP/burning, elevated troponins, to begin IV heparin.  Pt is S/P cardiac cath this afternoon; planned staged intervention to [facility]x tomorrow. Pharmacy is asked to resume heparin 4 hrs after TR band removal (per RN, TR band was removed at 2234 PM tonight).  H/H 16.9/47.8, plt 173  Goal of Therapy:  Heparin level 0.3-0.7 units/ml Monitor platelets by anticoagulation protocol: Yes   Plan:  Resume heparin infusion at 1350 units/hr at 0230 AM (~4 hrs after TR band removal) Check heparin level 6 hrs after restarting heparin infusion Monitor daily heparin level, CBC Monitor for bleeding F/U after staged intervention tomorrow, 5/19  Gillermina Hu, PharmD, BCPS, [facility] Clinical Pharmacist 12/07/2020 6:28 PM

## 2020-12-07 NOTE — ED Notes (Signed)
Pt placed on [facility] per EDP request

## 2020-12-07 NOTE — Consult Note (Signed)
CARDIOLOGY CONSULT NOTE  Patient IDIris Washington MRN: [id number] DOB/AGE: 50-19-1972 50 y.o.  Admit date: 12/07/2020 Referring Physician: Gerlene Fee, MD Reason for Consultation:  Chest pain  HPI:   50 y/o [place] man with hypertension, hyperlipidemia, tobacco abuse, strong family h/o CAD, with chest pain.  Patient sister Rovaida assisted with interpretation.  Patient presented to [facility] with retrosternal chest pain that started after dinner on 12/06/20 night. EKG with minimal inferior ST elevation, not meeting STEMI criteria. Trop HS 800-->5000. He is also incidentally COVID positive, first noted on Sunday 12/04/20 at outside facility. Chest Xray clear. No fever.  COVID test was checked for generalized fatigue symptoms.  Patient is currently chest pain-free.   Past Medical History:  Diagnosis Date  . Hyperlipidemia   . Hypertension   . Tobacco abuse      History reviewed. No pertinent surgical history.    Family History  Problem Relation Age of Onset  . Heart disease Father   . Heart attack Father 18  . Heart disease Brother   . Heart disease Brother   . Heart disease Brother   . Heart disease Brother      Social History: Social History   Socioeconomic History  . Marital status: Married    Spouse name: Not on file  . Number of children: 4  . Years of education: Not on file  . Highest education level: Not on file  Occupational History  . Not on file  Tobacco Use  . Smoking status: Current Every Day Smoker    Packs/day: 1.00    Years: 30.00    Pack years: 30.00    Types: Cigarettes  . Smokeless tobacco: Never Used  Vaping Use  . Vaping Use: Never used  Substance and Sexual Activity  . Alcohol use: Never  . Drug use: Never  . Sexual activity: Not on file  Other Topics Concern  . Not on file  Social History Narrative  . Not on file   Social Determinants of Health   Financial Resource Strain: Not on file  Food Insecurity: Not on  file  Transportation Needs: Not on file  Physical Activity: Not on file  Stress: Not on file  Social Connections: Not on file  Intimate Partner Violence: Not on file     Medications Prior to Admission  Medication Sig Dispense Refill Last Dose  . meloxicam (MOBIC) 15 MG tablet 1 tab(s)     . predniSONE (DELTASONE) 20 MG tablet 1 tab(s)     . fenofibrate (TRICOR) 48 MG tablet Take 96 mg by mouth daily.     Marland Kitchen ketoconazole (NIZORAL) 2 % shampoo Apply topically.     Marland Kitchen losartan (COZAAR) 100 MG tablet Take 1 tablet by mouth daily.     Marland Kitchen tiZANidine (ZANAFLEX) 4 MG tablet Take 4 mg by mouth every 8 (eight) hours as needed.       ROS    Physical Exam: Physical Exam   Labs:   Lab Results  Component Value Date   WBC 6.5 12/07/2020   HGB 16.9 12/07/2020   HCT 47.8 12/07/2020   MCV 86.4 12/07/2020   PLT 173 12/07/2020    Recent Labs  Lab 12/07/20 0416  NA 137  K 4.1  CL 105  CO2 23  BUN 12  CREATININE 0.70  CALCIUM 8.9  PROT 7.6  BILITOT 0.5  ALKPHOS 52  ALT 30  AST 38  GLUCOSE 122*    Lipid Panel  Component Value Date/Time   CHOL 132 10/01/2018 0843   TRIG 114 10/01/2018 0843   HDL 44 10/01/2018 0843   CHOLHDL 3.0 10/01/2018 0843   LDLCALC 65 10/01/2018 0843    BNP (last 3 results) N/A  HEMOGLOBIN A1C  Pending  Cardiac Panel (last 3 results) Results for [name], [name] (MRN [id number]) as of 12/07/2020 11:54  Ref. Range 12/07/2020 04:16 12/07/2020 06:18  Troponin I (High Sensitivity) Latest Ref Range: <18 ng/L 891 (HH) 3,861 (HH)    TSH N/A   Radiology: DG Chest 1 View  Result Date: 12/07/2020 CLINICAL DATA:  50 year old male with chest pain. EXAM: CHEST  1 VIEW COMPARISON:  None. FINDINGS: Portable AP view at 0505 hours. Normal lung volumes and mediastinal contours. Visualized tracheal air column is within normal limits. Allowing for portable technique the lungs are clear. No pneumothorax. No osseous abnormality identified. Negative visible bowel gas  pattern. IMPRESSION: Negative portable chest. Electronically Signed   By: Genevie Ann M.D.   On: 12/07/2020 05:13    Scheduled Meds: Continuous Infusions: . heparin 1,100 Units/hr (12/07/20 0602)  . nitroGLYCERIN 5 mcg/min (12/07/20 1017)   PRN Meds:.  CARDIAC STUDIES:  EKG 12/07/2020: Sinus rhythm 58 bpm Minimal ST elevation, inferior leads. Does not meet STEMI criteriea  Echocardiogram: Pending   Assessment & Recommendations:  50 y/o [place] man with hypertension, hyperlipidemia, tobacco abuse, strong family h/o CAD, with chest pain, COVID positive  Non-STEMI: Currently chest pain-free. HS Trop >3000 Recommend admission to hospitalist service. Recommend Aspirin 81 mg, Crestor 40 mg, IV heparin, IV nitroglycerin. Hold losartan. Workup and management for any COVID related illness as per primary team. Recommend echocardiogram. Will plan to perform cath with COVID precautions later this afternoon.      Nigel Mormon, MD Pager: [phone] Office: [phone]

## 2020-12-07 NOTE — ED Notes (Signed)
Monitor rang out nonsustained v-tach, review of rhythm strip with couplets of pvc, not sustained.  Pt awake, alert, rates pain 1/10, resting quietly.   Dr Roslynn Amble made aware and reviewed monitor strip and examined pt.  No additional orders.

## 2020-12-07 NOTE — H&P (View-Only) (Signed)
CARDIOLOGY CONSULT NOTE  Patient IDJeancarlos Washington MRN: [id number] DOB/AGE: [birth year] 50 y.o.  Admit date: 12/07/2020 Referring Physician: Gerlene Fee, MD Reason for Consultation:  Chest pain  HPI:   50 y/o Randall [place] man with hypertension, hyperlipidemia, tobacco abuse, strong family h/o CAD, with chest pain.  Patient sister Randall Washington assisted with interpretation.  Patient presented to [facility] with retrosternal chest pain that started after dinner on 12/06/20 night. EKG with minimal inferior ST elevation, not meeting STEMI criteria. Trop HS 800-->5000. He is also incidentally COVID positive, first noted on Sunday 12/04/20 at outside facility. Chest Xray clear. No fever.  COVID test was checked for generalized fatigue symptoms.  Patient is currently chest pain-free.   Past Medical History:  Diagnosis Date  . Hyperlipidemia   . Hypertension   . Tobacco abuse      History reviewed. No pertinent surgical history.    Family History  Problem Relation Age of Onset  . Heart disease Father   . Heart attack Father 52  . Heart disease Brother   . Heart disease Brother   . Heart disease Brother   . Heart disease Brother      Social History: Social History   Socioeconomic History  . Marital status: Married    Spouse name: Not on file  . Number of children: 4  . Years of education: Not on file  . Highest education level: Not on file  Occupational History  . Not on file  Tobacco Use  . Smoking status: Current Every Day Smoker    Packs/day: 1.00    Years: 30.00    Pack years: 30.00    Types: Cigarettes  . Smokeless tobacco: Never Used  Vaping Use  . Vaping Use: Never used  Substance and Sexual Activity  . Alcohol use: Never  . Drug use: Never  . Sexual activity: Not on file  Other Topics Concern  . Not on file  Social History Narrative  . Not on file   Social Determinants of Health   Financial Resource Strain: Not on file  Food Insecurity: Not on  file  Transportation Needs: Not on file  Physical Activity: Not on file  Stress: Not on file  Social Connections: Not on file  Intimate Partner Violence: Not on file     Medications Prior to Admission  Medication Sig Dispense Refill Last Dose  . meloxicam (MOBIC) 15 MG tablet 1 tab(s)     . predniSONE (DELTASONE) 20 MG tablet 1 tab(s)     . fenofibrate (TRICOR) 48 MG tablet Take 96 mg by mouth daily.     Marland Kitchen ketoconazole (NIZORAL) 2 % shampoo Apply topically.     Marland Kitchen losartan (COZAAR) 100 MG tablet Take 1 tablet by mouth daily.     Marland Kitchen tiZANidine (ZANAFLEX) 4 MG tablet Take 4 mg by mouth every 8 (eight) hours as needed.       ROS    Physical Exam: Physical Exam   Labs:   Lab Results  Component Value Date   WBC 6.5 12/07/2020   HGB 16.9 12/07/2020   HCT 47.8 12/07/2020   MCV 86.4 12/07/2020   PLT 173 12/07/2020    Recent Labs  Lab 12/07/20 0416  NA 137  K 4.1  CL 105  CO2 23  BUN 12  CREATININE 0.70  CALCIUM 8.9  PROT 7.6  BILITOT 0.5  ALKPHOS 52  ALT 30  AST 38  GLUCOSE 122*    Lipid Panel  Component Value Date/Time   CHOL 132 10/01/2018 0843   TRIG 114 10/01/2018 0843   HDL 44 10/01/2018 0843   CHOLHDL 3.0 10/01/2018 0843   LDLCALC 65 10/01/2018 0843    BNP (last 3 results) N/A  HEMOGLOBIN A1C  Pending  Cardiac Panel (last 3 results) Results for Randall, Washington (MRN [id number]) as of 12/07/2020 11:54  Ref. Range 12/07/2020 04:16 12/07/2020 06:18  Troponin I (High Sensitivity) Latest Ref Range: <18 ng/L 891 (HH) 3,861 (HH)    TSH N/A   Radiology: DG Chest 1 View  Result Date: 12/07/2020 CLINICAL DATA:  50 year old male with chest pain. EXAM: CHEST  1 VIEW COMPARISON:  None. FINDINGS: Portable AP view at 0505 hours. Normal lung volumes and mediastinal contours. Visualized tracheal air column is within normal limits. Allowing for portable technique the lungs are clear. No pneumothorax. No osseous abnormality identified. Negative visible bowel gas  pattern. IMPRESSION: Negative portable chest. Electronically Signed   By: Genevie Ann M.D.   On: 12/07/2020 05:13    Scheduled Meds: Continuous Infusions: . heparin 1,100 Units/hr (12/07/20 0602)  . nitroGLYCERIN 5 mcg/min (12/07/20 1017)   PRN Meds:.  CARDIAC STUDIES:  EKG 12/07/2020: Sinus rhythm 58 bpm Minimal ST elevation, inferior leads. Does not meet STEMI criteriea  Echocardiogram: Pending   Assessment & Recommendations:  50 y/o Randall Washington man with hypertension, hyperlipidemia, tobacco abuse, strong family h/o CAD, with chest pain, COVID positive  Non-STEMI: Currently chest pain-free. HS Trop >3000 Recommend admission to hospitalist service. Recommend Aspirin 81 mg, Crestor 40 mg, IV heparin, IV nitroglycerin. Hold losartan. Workup and management for any COVID related illness as per primary team. Recommend echocardiogram. Will plan to perform cath with COVID precautions later this afternoon.      Nigel Mormon, MD Pager: [phone] Office: [phone]

## 2020-12-07 NOTE — ED Triage Notes (Addendum)
Pt with left sided chest pain described as burning. Denies any cough, shob or nausea. Pt was dx with Covid 12/04/20

## 2020-12-07 NOTE — Progress Notes (Signed)
[place] for heparin Indication: chest pain/ACS  No Known Allergies  Patient Measurements: Height: 5\' 6"  (167.6 cm) Weight: 90.3 kg (199 lb) IBW/kg (Calculated) : 63.8 Heparin Dosing Weight: 80kg  Vital Signs: Temp: 98 F (36.7 C) (05/18 1030) Temp Source: Oral (05/18 1030) BP: 120/86 (05/18 1030) Pulse Rate: 60 (05/18 1030)  Labs: Recent Labs    12/07/20 0416 12/07/20 0618 12/07/20 1207  HGB 16.9  --   --   HCT 47.8  --   --   PLT 173  --   --   HEPARINUNFRC  --   --  0.13*  CREATININE 0.70  --   --   TROPONINIHS 891* 3,861*  --     Estimated Creatinine Clearance: 116.3 mL/min (by C-G formula based on SCr of 0.7 mg/dL).   Medical History: Past Medical History:  Diagnosis Date  . Depression   . GERD (gastroesophageal reflux disease)   . Hyperlipidemia   . Hypertension   . Neuromuscular disorder (HCC)    neck and shoulder pain right side down to fingers and numbess, steriod shots  . Tobacco abuse     Assessment: 50yo male c/o left-sided CP/burning, initial troponin elevated, to begin heparin.  Patient has now arrived at [facility], initial heparin level below goal at 0.13. No bleeding or IV issues noted. CBC within normal limits.   Goal of Therapy:  Heparin level 0.3-0.7 units/ml Monitor platelets by anticoagulation protocol: Yes   Plan:  Rebolus with 3000 units of heparin  Increase heparin to 1350 units/hr Recheck heparin level in 6 hours then daily  Erin Hearing PharmD., BCPS Clinical Pharmacist 12/07/2020 2:47 PM

## 2020-12-07 NOTE — ED Notes (Signed)
EDP requested repeat EKG

## 2020-12-07 NOTE — ED Provider Notes (Addendum)
[facility] Emergency Department Provider Note MRN:  [id number]  Arrival date & time: 12/07/20     Chief Complaint   Chest Pain   History of Present Illness   Randall Washington is a 50 y.o. year-old male with a history of hypertension presenting to the ED with chief complaint of chest pain.  Location: Central and left-sided chest Duration: 3 to 4 hours Onset: Sudden Timing: Constant Description: Burning Severity: 8 out of 10 Exacerbating/Alleviating Factors: Occurred shortly after eating Associated Symptoms: None Pertinent Negatives: Denies dizziness or diaphoresis, no nausea vomiting, no shortness of breath, no abdominal pain, no numbness or weakness to the arms or legs   Review of Systems  A complete 10 system review of systems was obtained and all systems are negative except as noted in the HPI and PMH.   Patient's Health History    Past Medical History:  Diagnosis Date  . Hyperlipidemia   . Hypertension   . Tobacco abuse     History reviewed. No pertinent surgical history.  Family History  Problem Relation Age of Onset  . Heart disease Father   . Heart attack Father 80  . Heart disease Brother   . Heart disease Brother   . Heart disease Brother   . Heart disease Brother     Social History   Socioeconomic History  . Marital status: Married    Spouse name: Not on file  . Number of children: 4  . Years of education: Not on file  . Highest education level: Not on file  Occupational History  . Not on file  Tobacco Use  . Smoking status: Current Every Day Smoker    Packs/day: 1.00    Years: 30.00    Pack years: 30.00    Types: Cigarettes  . Smokeless tobacco: Never Used  Vaping Use  . Vaping Use: Never used  Substance and Sexual Activity  . Alcohol use: Never  . Drug use: Never  . Sexual activity: Not on file  Other Topics Concern  . Not on file  Social History Narrative  . Not on file   Social Determinants of Health    Financial Resource Strain: Not on file  Food Insecurity: Not on file  Transportation Needs: Not on file  Physical Activity: Not on file  Stress: Not on file  Social Connections: Not on file  Intimate Partner Violence: Not on file     Physical Exam   Vitals:   12/07/20 0410  BP: (!) 159/99  Pulse: 68  Resp: 16  Temp: 98 F (36.7 C)  SpO2: 100%    CONSTITUTIONAL: Well-appearing, NAD NEURO:  Alert and oriented x 3, no focal deficits EYES:  eyes equal and reactive ENT/NECK:  no LAD, no JVD CARDIO: Regular rate, well-perfused, normal S1 and S2 PULM:  CTAB no wheezing or rhonchi GI/GU:  normal bowel sounds, non-distended, non-tender MSK/SPINE:  No gross deformities, no edema SKIN:  no rash, atraumatic PSYCH:  Appropriate speech and behavior  *Additional and/or pertinent findings included in MDM below  Diagnostic and Interventional Summary    EKG Interpretation  Date/Time:  Wednesday Dec 07 2020 04:17:59 EDT Ventricular Rate:  66 PR Interval:  155 QRS Duration: 110 QT Interval:  444 QTC Calculation: 466 R Axis:   14 Text Interpretation: Sinus rhythm Abnormal inferior Q waves Posterior infarct, recent ST elevation, consider inferior injury Confirmed by Gerlene Fee [phone]) on 12/07/2020 4:20:49 AM       EKG Interpretation  Date/Time:  Wednesday Dec 07 2020 05:46:43 EDT Ventricular Rate:  58 PR Interval:  146 QRS Duration: 119 QT Interval:  467 QTC Calculation: 459 R Axis:   14 Text Interpretation: Sinus rhythm Nonspecific intraventricular conduction delay Abnormal inferior Q waves Minimal ST elevation, inferior leads Confirmed by Gerlene Fee [phone]) on 12/07/2020 5:51:57 AM        Labs Reviewed  COMPREHENSIVE METABOLIC PANEL - Abnormal; Notable for the following components:      Result Value   Glucose, Bld 122 (*)    All other components within normal limits  TROPONIN I (HIGH SENSITIVITY) - Abnormal; Notable for the following components:   Troponin I  (High Sensitivity) 891 (*)    All other components within normal limits  RESP PANEL BY RT-PCR (FLU A&B, COVID) ARPGX2  CBC WITH DIFFERENTIAL/PLATELET  HEPARIN LEVEL (UNFRACTIONATED)  TROPONIN I (HIGH SENSITIVITY)    DG Chest 1 View  Final Result      Medications  heparin bolus via infusion 4,000 Units (has no administration in time range)  heparin ADULT infusion 100 units/mL (25000 units/234m) (has no administration in time range)  aspirin chewable tablet 324 mg (324 mg Oral Given 12/07/20 0426)  alum & mag hydroxide-simeth (MAALOX/MYLANTA) 200-200-20 MG/5ML suspension 30 mL (30 mLs Oral Given 12/07/20 0427)     Procedures  /  Critical Care .Critical Care Performed by: BMaudie Flakes MD Authorized by: BMaudie Flakes MD   Critical care provider statement:    Critical care time (minutes):  45   Critical care was necessary to treat or prevent imminent or life-threatening deterioration of the following conditions: Acute coronary syndrome.   Critical care was time spent personally by me on the following activities:  Discussions with consultants, evaluation of patient's response to treatment, examination of patient, ordering and performing treatments and interventions, ordering and review of laboratory studies, ordering and review of radiographic studies, pulse oximetry, re-evaluation of patient's condition, obtaining history from patient or surrogate and review of old charts    ED Course and Medical Decision Making  I have reviewed the triage vital signs, the nursing notes, and pertinent available records from the EMR.  Listed above are laboratory and imaging tests that I personally ordered, reviewed, and interpreted and then considered in my medical decision making (see below for details).  50years old, multiple cardiovascular risk factors however described as a burning pain shortly after eating and so GI etiology is favored.  Had a clean stress test per daughter 1 year ago.  EKG  is showing some nonspecific ST segment findings, STEMI criteria not met.  Unchanged with a repeat.  Will monitor closely, awaiting troponin.     First troponin over 600.  On reassessment patient's pain is largely resolved after aspirin and GI cocktail.  Repeat EKG largely unchanged.  Question ACS versus myocarditis in the setting of COVID-19, will consult cardiology.  Plan is for admission.  Starting heparin.  Spoke with Dr. PVirgina Jockof cardiology, who recommends hospitalist admission.  He will follow consultation for possible catheterization.  Dr. MNormand Sloopof hospitalist service accepts patient for admission.  Of note, patient exhibiting some ectopy, brief runs of PVCs but no sustained arrhythmia.  ZOLL placed on patient as precautionary measure.  MBarth Kirks BSedonia Small [email or web address]  Final Clinical Impressions(s) / ED Diagnoses     ICD-10-CM   1. ACS (acute coronary syndrome) (HCC)  I24.9   2. Chest pain  R07.9 DG Chest  1 View    DG Chest 1 View  3. COVID-19  U07.1     ED Discharge Orders    None       Discharge Instructions Discussed with and Provided to Patient:   Discharge Instructions   None       Maudie Flakes, MD 12/07/20 1121    Maudie Flakes, MD 12/07/20 [phone]

## 2020-12-07 NOTE — Plan of Care (Signed)
Patient and daughter educated on plan of care, tests and procedures, safety measures and precautions for covid. Decreased anxiety with increased knowledge noted for both. Results of cardiac cath and plan for tomorrow intervention discussed and patient and daughter are aware of plan for tomorrows procedure.

## 2020-12-07 NOTE — ED Notes (Signed)
Report provided to [facility] 

## 2020-12-07 NOTE — ED Notes (Signed)
EDP at bedside  

## 2020-12-07 NOTE — Progress Notes (Signed)
ANTICOAGULATION CONSULT NOTE - Initial Consult  Pharmacy Consult for heparin Indication: chest pain/ACS  No Known Allergies  Patient Measurements: Height: 5\' 6"  (167.6 cm) Weight: 90.3 kg (199 lb) IBW/kg (Calculated) : 63.8 Heparin Dosing Weight: 80kg  Vital Signs: Temp: 98 F (36.7 C) (05/18 0410) Temp Source: Oral (05/18 0410) BP: 159/99 (05/18 0410) Pulse Rate: 68 (05/18 0410)  Labs: Recent Labs    12/07/20 0416  HGB 16.9  HCT 47.8  PLT 173  CREATININE 0.70  TROPONINIHS 891*    Estimated Creatinine Clearance: 116.3 mL/min (by C-G formula based on SCr of 0.7 mg/dL).   Medical History: Past Medical History:  Diagnosis Date  . Hyperlipidemia   . Hypertension   . Tobacco abuse     Assessment: 50yo male c/o left-sided CP/burning, initial troponin elevated, to begin heparin.  Goal of Therapy:  Heparin level 0.3-0.7 units/ml Monitor platelets by anticoagulation protocol: Yes   Plan:  Heparin 4000 units IV bolus x1 followed by gtt at 1100 units/hr and monitor heparin levels and CBC.  Wynona Neat, PharmD, BCPS  12/07/2020,5:49 AM

## 2020-12-07 NOTE — Interval H&P Note (Signed)
History and Physical Interval Note:  12/07/2020 3:03 PM  Randall Washington  has presented today for surgery, with the diagnosis of nstemi.  The various methods of treatment have been discussed with the patient and family. After consideration of risks, benefits and other options for treatment, the patient has consented to  Procedure(s): LEFT HEART CATH AND CORONARY ANGIOGRAPHY (N/A) as a surgical intervention.  The patient's history has been reviewed, patient examined, no change in status, stable for surgery.  I have reviewed the patient's chart and labs.  Questions were answered to the patient's satisfaction.    2016 Appropriate Use Criteria for Coronary Revascularization in Patients With Acute Coronary Syndrome NSTEMI/UA High Risk (TIMI Score 5-7) NSTEMI/Unstable angina, stabilized patient at high risk Link Here: sistemancia.com Indication:  Revascularization by PCI or CABG of 1 or more arteries in a patient with NSTEMI or unstable angina with Stabilization after presentation High risk for clinical events  A (7) Indication: 16; Score 7   [place]

## 2020-12-07 NOTE — Progress Notes (Addendum)
  Echocardiogram 2D Echocardiogram with 3D and strain has been performed. Dr. Virgina Jock contacted to read at 1:17 pm  Hassie Bruce 12/07/2020, 1:17 PM

## 2020-12-08 ENCOUNTER — Ambulatory Visit (HOSPITAL_COMMUNITY): Admission: RE | Admit: 2020-12-08 | Payer: Medicaid Other | Source: Home / Self Care | Admitting: Cardiology

## 2020-12-08 ENCOUNTER — Encounter (HOSPITAL_COMMUNITY): Payer: Self-pay | Admitting: Cardiology

## 2020-12-08 ENCOUNTER — Other Ambulatory Visit (HOSPITAL_COMMUNITY): Payer: Self-pay

## 2020-12-08 ENCOUNTER — Inpatient Hospital Stay (HOSPITAL_COMMUNITY)
Admission: EM | Disposition: A | Discharge status: Home / Self Care | Payer: Self-pay | Source: Home / Self Care | Attending: Internal Medicine

## 2020-12-08 DIAGNOSIS — U071 COVID-19: Secondary | ICD-10-CM

## 2020-12-08 DIAGNOSIS — E785 Hyperlipidemia, unspecified: Secondary | ICD-10-CM

## 2020-12-08 DIAGNOSIS — I214 Non-ST elevation (NSTEMI) myocardial infarction: Principal | ICD-10-CM

## 2020-12-08 DIAGNOSIS — I10 Essential (primary) hypertension: Secondary | ICD-10-CM

## 2020-12-08 HISTORY — PX: CORONARY STENT INTERVENTION: CATH118234

## 2020-12-08 LAB — COMPREHENSIVE METABOLIC PANEL
ALT: 43 U/L (ref 0–44)
AST: 147 U/L — ABNORMAL HIGH (ref 15–41)
Albumin: 3.7 g/dL (ref 3.5–5.0)
Alkaline Phosphatase: 36 U/L — ABNORMAL LOW (ref 38–126)
Anion gap: 8 (ref 5–15)
BUN: 10 mg/dL (ref 6–20)
CO2: 21 mmol/L — ABNORMAL LOW (ref 22–32)
Calcium: 8.6 mg/dL — ABNORMAL LOW (ref 8.9–10.3)
Chloride: 106 mmol/L (ref 98–111)
Creatinine, Ser: 0.83 mg/dL (ref 0.61–1.24)
GFR, Estimated: >60 mL/min (ref >60)
Glucose, Bld: 130 mg/dL — ABNORMAL HIGH (ref 70–99)
Potassium: 3.8 mmol/L (ref 3.5–5.1)
Sodium: 135 mmol/L (ref 135–145)
Total Bilirubin: 0.8 mg/dL (ref 0.3–1.2)
Total Protein: 6.2 g/dL — ABNORMAL LOW (ref 6.5–8.1)

## 2020-12-08 LAB — CBC WITH DIFFERENTIAL/PLATELET
Abs Immature Granulocytes: 0.05 10*3/uL (ref 0.00–0.07)
Basophils Absolute: 0 10*3/uL (ref 0.0–0.1)
Basophils Relative: 0 %
Eosinophils Absolute: 0 10*3/uL (ref 0.0–0.5)
Eosinophils Relative: 0 %
HCT: 42.8 % (ref 39.0–52.0)
Hemoglobin: 14.6 g/dL (ref 13.0–17.0)
Immature Granulocytes: 1 %
Lymphocytes Relative: 23 %
Lymphs Abs: 1.5 10*3/uL (ref 0.7–4.0)
MCH: 30.1 pg (ref 26.0–34.0)
MCHC: 34.1 g/dL (ref 30.0–36.0)
MCV: 88.2 fL (ref 80.0–100.0)
Monocytes Absolute: 0.8 10*3/uL (ref 0.1–1.0)
Monocytes Relative: 12 %
Neutro Abs: 4.1 10*3/uL (ref 1.7–7.7)
Neutrophils Relative %: 64 %
Platelets: 157 10*3/uL (ref 150–400)
RBC: 4.85 MIL/uL (ref 4.22–5.81)
RDW: 12.4 % (ref 11.5–15.5)
WBC: 6.4 10*3/uL (ref 4.0–10.5)
nRBC: 0 % (ref 0.0–0.2)

## 2020-12-08 LAB — POCT ACTIVATED CLOTTING TIME
Activated Clotting Time: 238 seconds
Activated Clotting Time: 380 seconds
Activated Clotting Time: 696 seconds
Activated Clotting Time: 255 seconds
Activated Clotting Time: 172 seconds

## 2020-12-08 LAB — HEPARIN LEVEL (UNFRACTIONATED): Heparin Unfractionated: 0.13 IU/mL — ABNORMAL LOW (ref 0.30–0.70)

## 2020-12-08 LAB — D-DIMER, QUANTITATIVE: D-Dimer, Quant: <0.27 ug/mL-FEU (ref 0.00–0.50)

## 2020-12-08 LAB — PHOSPHORUS: Phosphorus: 3.4 mg/dL (ref 2.5–4.6)

## 2020-12-08 LAB — FERRITIN: Ferritin: 326 ng/mL (ref 24–336)

## 2020-12-08 LAB — MAGNESIUM: Magnesium: 1.8 mg/dL (ref 1.7–2.4)

## 2020-12-08 LAB — C-REACTIVE PROTEIN: CRP: 1.6 mg/dL — ABNORMAL HIGH (ref <1.0)

## 2020-12-08 SURGERY — CORONARY STENT INTERVENTION
Anesthesia: LOCAL

## 2020-12-08 MED ORDER — SODIUM CHLORIDE 0.9% FLUSH
3.0000 mL | Freq: Two times a day (BID) | INTRAVENOUS | Status: DC
  Administered 2020-12-09: 3 mL via INTRAVENOUS

## 2020-12-08 MED ORDER — HEPARIN SODIUM (PORCINE) 1000 UNIT/ML IJ SOLN
INTRAMUSCULAR | Status: DC | PRN
  Administered 2020-12-08: 10000 [IU] via INTRAVENOUS

## 2020-12-08 MED ORDER — ROSUVASTATIN CALCIUM 20 MG PO TABS
20.0000 mg | ORAL_TABLET | Freq: Every day | ORAL | 1 refills | Status: DC
  Filled 2020-12-08: qty 30, 30d supply, fill #0

## 2020-12-08 MED ORDER — SODIUM CHLORIDE 0.9 % IV SOLN: INTRAVENOUS | Status: AC

## 2020-12-08 MED ORDER — ONDANSETRON HCL 4 MG/2ML IJ SOLN: 4.0000 mg | Freq: Four times a day (QID) | INTRAMUSCULAR | Status: DC | PRN

## 2020-12-08 MED ORDER — NITROGLYCERIN 0.4 MG SL SUBL
0.4000 mg | SUBLINGUAL_TABLET | SUBLINGUAL | 3 refills | Status: AC | PRN
  Filled 2020-12-08: qty 25, 8d supply, fill #0

## 2020-12-08 MED ORDER — HYDRALAZINE HCL 20 MG/ML IJ SOLN: 10.0000 mg | INTRAMUSCULAR | Status: AC | PRN

## 2020-12-08 MED ORDER — MIDAZOLAM HCL 2 MG/2ML IJ SOLN
INTRAMUSCULAR | Status: AC
  Filled 2020-12-08: qty 2

## 2020-12-08 MED ORDER — SODIUM CHLORIDE 0.9 % WEIGHT BASED INFUSION
3.0000 mL/kg/h | INTRAVENOUS | Status: AC
  Administered 2020-12-08: 3 mL/kg/h via INTRAVENOUS

## 2020-12-08 MED ORDER — SODIUM CHLORIDE 0.9% FLUSH
3.0000 mL | Freq: Two times a day (BID) | INTRAVENOUS | Status: DC
  Administered 2020-12-08: 3 mL via INTRAVENOUS

## 2020-12-08 MED ORDER — SODIUM CHLORIDE 0.9 % IV SOLN: INTRAVENOUS | Status: DC | PRN

## 2020-12-08 MED ORDER — LIDOCAINE HCL (PF) 1 % IJ SOLN
INTRAMUSCULAR | Status: DC | PRN
  Administered 2020-12-08: 25 mL via INTRADERMAL

## 2020-12-08 MED ORDER — ACETAMINOPHEN 325 MG PO TABS
650.0000 mg | ORAL_TABLET | ORAL | Status: DC | PRN
  Filled 2020-12-08: qty 2

## 2020-12-08 MED ORDER — SODIUM CHLORIDE 0.9% FLUSH: 3.0000 mL | INTRAVENOUS | Status: DC | PRN

## 2020-12-08 MED ORDER — METHYLPREDNISOLONE SODIUM SUCC 125 MG IJ SOLR: 125.0000 mg | Freq: Once | INTRAMUSCULAR | Status: DC | PRN

## 2020-12-08 MED ORDER — MIDAZOLAM HCL 2 MG/2ML IJ SOLN
INTRAMUSCULAR | Status: DC | PRN
Start: 2020-12-08 — End: 2020-12-08
  Administered 2020-12-08: 1 mg via INTRAVENOUS

## 2020-12-08 MED ORDER — ALBUTEROL SULFATE HFA 108 (90 BASE) MCG/ACT IN AERS
2.0000 | INHALATION_SPRAY | Freq: Once | RESPIRATORY_TRACT | Status: DC | PRN
  Filled 2020-12-08: qty 6.7

## 2020-12-08 MED ORDER — SODIUM CHLORIDE 0.9 % IV SOLN: 250.0000 mL | INTRAVENOUS | Status: DC | PRN

## 2020-12-08 MED ORDER — DIPHENHYDRAMINE HCL 50 MG/ML IJ SOLN
50.0000 mg | Freq: Once | INTRAMUSCULAR | Status: DC | PRN
  Filled 2020-12-08: qty 1

## 2020-12-08 MED ORDER — FENTANYL CITRATE (PF) 100 MCG/2ML IJ SOLN
INTRAMUSCULAR | Status: AC
  Filled 2020-12-08: qty 2

## 2020-12-08 MED ORDER — EPINEPHRINE 0.3 MG/0.3ML IJ SOAJ: 0.3000 mg | Freq: Once | INTRAMUSCULAR | Status: DC | PRN

## 2020-12-08 MED ORDER — ROSUVASTATIN CALCIUM 20 MG PO TABS
40.0000 mg | ORAL_TABLET | Freq: Every day | ORAL | Status: DC
  Administered 2020-12-08 – 2020-12-09 (×2): 40 mg via ORAL
  Filled 2020-12-08 (×2): qty 2

## 2020-12-08 MED ORDER — TICAGRELOR 90 MG PO TABS
90.0000 mg | ORAL_TABLET | Freq: Two times a day (BID) | ORAL | 2 refills | Status: DC
  Filled 2020-12-08: qty 60, 30d supply, fill #0

## 2020-12-08 MED ORDER — LIDOCAINE HCL (PF) 1 % IJ SOLN
INTRAMUSCULAR | Status: AC
  Filled 2020-12-08: qty 30

## 2020-12-08 MED ORDER — SODIUM CHLORIDE 0.9 % WEIGHT BASED INFUSION: 3.0000 mL/kg/h | INTRAVENOUS | Status: DC

## 2020-12-08 MED ORDER — SODIUM CHLORIDE 0.9 % WEIGHT BASED INFUSION: 1.0000 mL/kg/h | INTRAVENOUS | Status: DC

## 2020-12-08 MED ORDER — LABETALOL HCL 5 MG/ML IV SOLN: 10.0000 mg | INTRAVENOUS | Status: AC | PRN

## 2020-12-08 MED ORDER — ENOXAPARIN SODIUM 40 MG/0.4ML IJ SOSY
40.0000 mg | PREFILLED_SYRINGE | INTRAMUSCULAR | Status: DC
  Administered 2020-12-09: 40 mg via SUBCUTANEOUS
  Filled 2020-12-08 (×2): qty 0.4

## 2020-12-08 MED ORDER — ALUM & MAG HYDROXIDE-SIMETH 200-200-20 MG/5ML PO SUSP
30.0000 mL | ORAL | Status: DC | PRN
  Filled 2020-12-08: qty 30

## 2020-12-08 MED ORDER — ASPIRIN 81 MG PO TBEC
81.0000 mg | DELAYED_RELEASE_TABLET | Freq: Every day | ORAL | 11 refills | Status: DC
  Filled 2020-12-08: qty 30, 30d supply, fill #0

## 2020-12-08 MED ORDER — METOPROLOL SUCCINATE ER 25 MG PO TB24
12.5000 mg | ORAL_TABLET | Freq: Every day | ORAL | 2 refills | Status: DC
  Filled 2020-12-08: qty 30, 60d supply, fill #0

## 2020-12-08 MED ORDER — FAMOTIDINE IN NACL 20-0.9 MG/50ML-% IV SOLN
20.0000 mg | Freq: Once | INTRAVENOUS | Status: DC | PRN
  Filled 2020-12-08: qty 50

## 2020-12-08 MED ORDER — IOHEXOL 350 MG/ML SOLN
INTRAVENOUS | Status: DC | PRN
  Administered 2020-12-08: 90 mL via INTRA_ARTERIAL

## 2020-12-08 MED ORDER — LOSARTAN POTASSIUM 100 MG PO TABS: 50.0000 mg | ORAL_TABLET | Freq: Every day | ORAL | 0 refills | Status: DC

## 2020-12-08 MED ORDER — METOPROLOL SUCCINATE 12.5 MG HALF TABLET
12.5000 mg | ORAL_TABLET | Freq: Every day | ORAL | Status: DC
  Administered 2020-12-09: 12.5 mg via ORAL
  Filled 2020-12-08 (×2): qty 1

## 2020-12-08 MED ORDER — SODIUM CHLORIDE 0.9% FLUSH
3.0000 mL | INTRAVENOUS | Status: DC | PRN
  Administered 2020-12-09: 3 mL via INTRAVENOUS

## 2020-12-08 MED ORDER — HEPARIN (PORCINE) IN NACL 1000-0.9 UT/500ML-% IV SOLN
INTRAVENOUS | Status: DC | PRN
  Administered 2020-12-08 (×2): 500 mL

## 2020-12-08 MED ORDER — NITROGLYCERIN 1 MG/10 ML FOR IR/CATH LAB
INTRA_ARTERIAL | Status: DC | PRN
  Administered 2020-12-08 (×3): 200 ug via INTRACORONARY

## 2020-12-08 MED ORDER — BEBTELOVIMAB 175 MG/2 ML IV (EUA)
175.0000 mg | Freq: Once | INTRAMUSCULAR | Status: AC
  Administered 2020-12-08: 175 mg via INTRAVENOUS
  Filled 2020-12-08: qty 2

## 2020-12-08 MED ORDER — NITROGLYCERIN 1 MG/10 ML FOR IR/CATH LAB
INTRA_ARTERIAL | Status: AC
  Filled 2020-12-08: qty 10

## 2020-12-08 MED ORDER — EPINEPHRINE 0.3 MG/0.3ML IJ SOAJ
0.3000 mg | Freq: Once | INTRAMUSCULAR | Status: DC | PRN
  Filled 2020-12-08 (×2): qty 0.6

## 2020-12-08 MED ORDER — SODIUM CHLORIDE 0.9 % WEIGHT BASED INFUSION
1.0000 mL/kg/h | INTRAVENOUS | Status: DC
  Administered 2020-12-08: 1 mL/kg/h via INTRAVENOUS

## 2020-12-08 MED ORDER — HEPARIN (PORCINE) IN NACL 1000-0.9 UT/500ML-% IV SOLN
INTRAVENOUS | Status: AC
  Filled 2020-12-08: qty 1500

## 2020-12-08 MED ORDER — HEPARIN SODIUM (PORCINE) 1000 UNIT/ML IJ SOLN
INTRAMUSCULAR | Status: AC
  Filled 2020-12-08: qty 1

## 2020-12-08 MED ORDER — FENTANYL CITRATE (PF) 100 MCG/2ML IJ SOLN
INTRAMUSCULAR | Status: DC | PRN
  Administered 2020-12-08: 50 ug via INTRAVENOUS

## 2020-12-08 MED FILL — Clopidogrel Bisulfate Tab 300 MG (Base Equiv): ORAL | Qty: 2 | Status: AC

## 2020-12-08 SURGICAL SUPPLY — 18 items
BALLN SAPPHIRE 2.5X15 (BALLOONS) ×2
BALLN SAPPHIRE 3.0X15 (BALLOONS) ×2
BALLOON SAPPHIRE 2.5X15 (BALLOONS) ×1 IMPLANT
BALLOON SAPPHIRE 3.0X15 (BALLOONS) ×1 IMPLANT
CATH LAUNCHER 6FR EBU3.5 (CATHETERS) ×2 IMPLANT
KIT ENCORE 26 ADVANTAGE (KITS) ×2 IMPLANT
KIT HEART LEFT (KITS) ×2 IMPLANT
KIT MICROPUNCTURE NIT STIFF (SHEATH) ×4 IMPLANT
PACK CARDIAC CATHETERIZATION (CUSTOM PROCEDURE TRAY) ×2 IMPLANT
SHEATH PINNACLE 6F 10CM (SHEATH) ×2 IMPLANT
SHEATH PROBE COVER 6X72 (BAG) ×2 IMPLANT
STENT RESOLUTE ONYX 2.5X15 (Permanent Stent) ×2 IMPLANT
STENT RESOLUTE ONYX 2.75X26 (Permanent Stent) ×2 IMPLANT
TRANSDUCER W/STOPCOCK (MISCELLANEOUS) ×2 IMPLANT
TUBING CIL FLEX 10 FLL-RA (TUBING) ×2 IMPLANT
VALVE COPILOT STAT (MISCELLANEOUS) ×2 IMPLANT
WIRE ASAHI PROWATER 180CM (WIRE) ×2 IMPLANT
WIRE EMERALD 3MM-J .035X150CM (WIRE) ×2 IMPLANT

## 2020-12-08 NOTE — Progress Notes (Signed)
Bebtelovimab inj given Rt AC IV. VSS. Instructed to notify RN for allergic type reactions. Verbalized understanding. Daughter into room at bedside.

## 2020-12-08 NOTE — Progress Notes (Signed)
Pharmacy COVID-19 Monoclonal Antibody Screening  Randall Washington was identified as being not hospitalized with symptoms from Covid-19 on admission. The patient may qualify for the use of monoclonal antibodies (mAB) for COVID-19 viral infection to prevent worsening symptoms stemming from Covid-19 infection.  The patient was identified based on a positive COVID-19 PCR and not requiring the use of supplemental oxygen at this time.  This patient meets the FDA criteria for Emergency Use Authorization of casirivimab/imdevimab.   Has a (+) direct SARS-CoV-2 viral test result   Is NOT hospitalized due to COVID-19   Is within 10 days of symptom onset   Has at least one of the high risk factor(s) for progression to severe COVID-19 and/or hospitalization as defined in EUA.   Specific high risk criteria: cardiovascular disease  Additionally, patient is (select one: partially vaccinated) against COVID-19. Additional testing was performed to determine if patient would be as risk for worsening infection.  Cycle time on the PCR COVID test was < 32 indicating high viral load    Due to low Cycle time, patient is eligible for therapy.  This eligibility and indication for treatment was discussed with the patient's physician:Dr. Oren Binet MD  Plan:  Based on the above discussion, it was decided that the patient will be given bebtelovimab  Erin Hearing PharmD., BCPS Clinical Pharmacist 12/08/2020 10:32 AM

## 2020-12-08 NOTE — Progress Notes (Signed)
VS remain stable. No s/s or c/o allergic reaction s/p Bebtelovimab Inj.

## 2020-12-08 NOTE — Interval H&P Note (Signed)
History and Physical Interval Note:  12/08/2020 2:37 PM  Randall Washington  has presented today for surgery, with the diagnosis of cad.  The various methods of treatment have been discussed with the patient and family. After consideration of risks, benefits and other options for treatment, the patient has consented to  Procedure(s): CORONARY STENT INTERVENTION (N/A) as a surgical intervention.  The patient's history has been reviewed, patient examined, no change in status, stable for surgery.  I have reviewed the patient's chart and labs.  Questions were answered to the patient's satisfaction.    2016 Appropriate Use Criteria for Coronary Revascularization in Patients With Acute Coronary Syndrome NSTEMI/UA High Risk (TIMI Score 5-7) NSTEMI/Unstable angina, stabilized patient at high risk Link Here: sistemancia.com Indication:  Revascularization by PCI or CABG of 1 or more arteries in a patient with NSTEMI or unstable angina with Stabilization after presentation High risk for clinical events  A (7) Indication: 16; Score 7   [place]

## 2020-12-08 NOTE — Progress Notes (Signed)
17fr sheath aspirated and removed from right femoral artery. Manual pressure applied for 20 minutes. Site level 0, no S+S of hematoma. Bedrest instructions given interpreted by family member.   Tegaderm dressing applied. Bilateral dp pulses palpable 2+, pt pulses 1+  Bedrest begins at 19:04:00

## 2020-12-08 NOTE — Plan of Care (Signed)
  Problem: Education: Goal: Knowledge of General Education information will improve Description: Including pain rating scale, medication(s)/side effects and non-pharmacologic comfort measures Outcome: Progressing   Problem: Clinical Measurements: Goal: Cardiovascular complication will be avoided Outcome: Progressing   Problem: Activity: Goal: Risk for activity intolerance will decrease Outcome: Progressing   Problem: Nutrition: Goal: Adequate nutrition will be maintained Outcome: Progressing   Problem: Pain Managment: Goal: General experience of comfort will improve Outcome: Progressing

## 2020-12-08 NOTE — Progress Notes (Signed)
[place] for heparin Indication: chest pain/ACS  No Known Allergies  Patient Measurements: Height: 5\' 6"  (167.6 cm) Weight: 90.3 kg (199 lb) IBW/kg (Calculated) : 63.8 Heparin Dosing Weight: 80kg  Vital Signs: BP: 113/67 (05/19 0453) Pulse Rate: 74 (05/19 0453)  Labs: Recent Labs    12/07/20 0416 12/07/20 0618 12/07/20 1207 12/08/20 0752  HGB 16.9  --   --  14.6  HCT 47.8  --   --  42.8  PLT 173  --   --  157  HEPARINUNFRC  --   --  0.13* 0.13*  CREATININE 0.70  --   --  0.83  TROPONINIHS 891* 3,861*  --   --     Estimated Creatinine Clearance: 112 mL/min (by C-G formula based on SCr of 0.83 mg/dL).   Medical History: Past Medical History:  Diagnosis Date  . Depression   . GERD (gastroesophageal reflux disease)   . Hyperlipidemia   . Hypertension   . Neuromuscular disorder (HCC)    neck and shoulder pain right side down to fingers and numbess, steriod shots  . Tobacco abuse     Assessment: 50yo male c/o left-sided CP/burning, initial troponin elevated, to begin heparin.  Follow up heparin level is still below goal at 0.13. No bleeding or IV issues noted. CBC within normal limits.   Goal of Therapy:  Heparin level 0.3-0.7 units/ml Monitor platelets by anticoagulation protocol: Yes   Plan:  Increase heparin to 1550 units/hr Follow up after cath  Erin Hearing PharmD., BCPS Clinical Pharmacist 12/08/2020 10:33 AM

## 2020-12-08 NOTE — TOC Benefit Eligibility Note (Signed)
Patient Teacher, English as a foreign language completed.    The patient is currently admitted and upon discharge could be taking Brilinta 90 mg.  The current 30 day co-pay is, $3.00.   The patient is insured through [place], [place] Patient Advocate Specialist [place] Team Direct Number: [phone]  Fax: [phone]

## 2020-12-08 NOTE — Progress Notes (Signed)
Subjective:  Patient underwent left heart catheterization and coronary angiography yesterday revealing 90% stenosis of the prox-mid LCx culprit lesion.  Recommendation was to plan for staged PCI later today. Echocardiogram revealed low normal LVEF at 50-55% without regional wall motion abnormalities.  Patient seen and examined at approximately 7:40 AM Patient's daughter presents at bedside and assists with interpretation.  Patient is complaining of mild chest discomfort, which he describes as "heaviness" which started yesterday following heart catheterization.  Patient denies shortness of breath.  Notably he has been up in his room walking to and from the restroom without issue and without exacerbation of chest discomfort.  Intake/Output from previous day:  I/O last 3 completed shifts: In: 85.8 [I.V.:85.8] Out: -  No intake/output data recorded.  Blood pressure 113/67, pulse 74, temperature 98 F (36.7 C), temperature source Oral, resp. rate 16, height '5\' 6"'  (1.676 m), weight 90.3 kg, SpO2 92 %. Physical Exam Vitals reviewed.  Constitutional:      Appearance: He is obese.  HENT:     Head: Normocephalic and atraumatic.  Cardiovascular:     Rate and Rhythm: Normal rate and regular rhythm.     Pulses: Intact distal pulses.     Heart sounds: S1 normal and S2 normal. No murmur heard. No gallop.   Pulmonary:     Effort: Pulmonary effort is normal. No respiratory distress.     Breath sounds: No wheezing, rhonchi or rales.  Abdominal:     General: Bowel sounds are normal.  Musculoskeletal:     Right lower leg: No edema.     Left lower leg: No edema.  Skin:    General: Skin is warm and dry.  Neurological:     General: No focal deficit present.     Mental Status: He is alert.     Lab Results: BMP BNP (last 3 results) No results for input(s): BNP in the last 8760 hours.  ProBNP (last 3 results) No results for input(s): PROBNP in the last 8760 hours. BMP Latest Ref Rng &  Units 12/07/2020 10/01/2018  Glucose 70 - 99 mg/dL 122(H) 118(H)  BUN 6 - 20 mg/dL 12 17  Creatinine 0.61 - 1.24 mg/dL 0.70 0.77  BUN/Creat Ratio 9 - 20 - 22(H)  Sodium 135 - 145 mmol/L 137 142  Potassium 3.5 - 5.1 mmol/L 4.1 4.8  Chloride 98 - 111 mmol/L 105 108(H)  CO2 22 - 32 mmol/L 23 22  Calcium 8.9 - 10.3 mg/dL 8.9 9.7   Hepatic Function Latest Ref Rng & Units 12/07/2020  Total Protein 6.5 - 8.1 g/dL 7.6  Albumin 3.5 - 5.0 g/dL 4.4  AST 15 - 41 U/L 38  ALT 0 - 44 U/L 30  Alk Phosphatase 38 - 126 U/L 52  Total Bilirubin 0.3 - 1.2 mg/dL 0.5   CBC Latest Ref Rng & Units 12/07/2020 10/01/2018  WBC 4.0 - 10.5 K/uL 6.5 7.2  Hemoglobin 13.0 - 17.0 g/dL 16.9 14.8  Hematocrit 39.0 - 52.0 % 47.8 42.8  Platelets 150 - 400 K/uL 173 201   Lipid Panel     Component Value Date/Time   CHOL 155 12/07/2020 1207   CHOL 132 10/01/2018 0843   TRIG 69 12/07/2020 1207   HDL 43 12/07/2020 1207   HDL 44 10/01/2018 0843   CHOLHDL 3.6 12/07/2020 1207   VLDL 14 12/07/2020 1207   LDLCALC 98 12/07/2020 1207   LDLCALC 65 10/01/2018 0843   Cardiac Panel (last 3 results) No results for input(s): CKTOTAL, CKMB,  TROPONINI, RELINDX in the last 72 hours.  HEMOGLOBIN A1C Lab Results  Component Value Date   HGBA1C 6.1 (H) 12/07/2020   MPG 128.37 12/07/2020   TSH No results for input(s): TSH in the last 8760 hours.  Imaging: DG Chest 1 View 12/07/2020 Normal lung volumes and mediastinal contours. Visualized tracheal air column is within normal limits. Allowing for portable technique the lungs are clear. No pneumothorax. No osseous abnormality identified. Negative visible bowel gas pattern.  IMPRESSION: Negative portable chest.    Cardiac Studies: EKG 12/07/2020: Sinus rhythm 58 bpm Minimal ST elevation, inferior leads. Does not meet STEMI criteriea  Echocardiogram 12/07/2020:  1. Left ventricular ejection fraction, by estimation, is 50 to 55%. The  left ventricle has low normal function. The  left ventricle has no regional  wall motion abnormalities. There is mild left ventricular hypertrophy.  Left ventricular diastolic  parameters are consistent with Grade I diastolic dysfunction (impaired  relaxation). The average left ventricular global longitudinal strain is  -15.7 %, which is mildly reduced.  2. Right ventricular systolic function is low normal. The right  ventricular size is normal.  3. No significant valvular abnormality.  4. The inferior vena cava is normal in size with greater than 50%  respiratory variability, suggesting right atrial pressure of 3 mmHg.  Left heart catheterization and coronary angiography 12/07/2020:  LM: Normal LAD: Minimal luminal irregularities        Diag 1 ostial 50% stenosis Ramus: Ostial 40% stenosis LCx: Prox-mid tandem 90% stenoses RCA: Mid vessel occlusion        Left-to-right collaterals frill distal RCA  Plan for staged intervention to LCx tomorrow. Resume IV heparin after TR band removal.   Scheduled Meds: . aspirin EC  81 mg Oral Daily  . fenofibrate  160 mg Oral Daily  . meloxicam  15 mg Oral Daily  . nicotine  21 mg Transdermal Daily  . predniSONE  20 mg Oral Q breakfast  . sodium chloride flush  3 mL Intravenous Q12H  . sodium chloride flush  3 mL Intravenous Q12H  . ticagrelor  90 mg Oral BID   Continuous Infusions: . sodium chloride    . sodium chloride    . sodium chloride     Followed by  . sodium chloride    . heparin 1,350 Units/hr (12/08/20 0201)  . nitroGLYCERIN 10 mcg/min (12/07/20 2156)   PRN Meds:.sodium chloride, sodium chloride, acetaminophen, nitroGLYCERIN, ondansetron (ZOFRAN) IV, sodium chloride flush, sodium chloride flush, tiZANidine, zolpidem  Assessment/Plan:   Non-STEMI: Coronary angiography yesterday revealed 90% stenosis of LCx.  Presently complains of mild chest "heaviness" this morning. We will perform repeat left heart catheterization with planned PCI to LCx today. Recommend  initiation of guideline directed medical therapy as able prior to discharge.  Will defer management of COVID-19 related illness to primary team.  Patient was seen in collaboration with Dr. Einar Gip.    Alethia Berthold, PA-C 12/08/2020, 8:13 AM Office: [phone]

## 2020-12-08 NOTE — Progress Notes (Signed)
PROGRESS NOTE                                                                                                                                                                                                             Patient Demographics:    Randall Washington, is a 50 y.o. male, DOB - 28-Sep-1970, [id number]  Outpatient Primary MD for the patient is Benito Mccreedy, MD   Admit date - 12/07/2020   LOS - 1  Chief Complaint  Patient presents with  . Chest Pain       Brief Narrative: Patient is a 50 y.o. male with PMHx of HTN, HLD, who was diagnosed with COVID-19 on 5/15 (fever/weakness)-presenting with non-STEMI.  COVID-19 vaccinated status: Vaccinated including booster  Significant Events: 5/15>> diagnosed with COVID-19 5/18>> Admit to [facility] for chest pain-found to have non-STEMI  Significant studies: 5/18>> chest x-ray: No pneumonia 5/18>> Echo: EF [id number], grade 1 diastolic dysfunction [id number] LHC: 90% proximal left circumflex lesion  COVID-19 medications: None  Antibiotics: None  Microbiology data: Blood culture:  Procedures: 5/18>> left heart catheterization: LM:  Normal LCx: Prox-mid tandem 90% stenoses RCA: Mid vessel occlusion        Left-to-right collaterals frill distal RCA LAD:  Minimal luminal irregularities  Diag 1 ostial 50% stenosis  Ramus: Ostial 40% stenosis   Consults: Cards  DVT prophylaxis: SCD's Start: 12/07/20 1801 IV heparin    Subjective:    Randall Washington today is lying comfortably in bed.  Denies any chest pain.   Assessment  & Plan :   Non-STEMI: Continue antiplatelets/statin/IV heparin-cards following for staged PCI today.  COVID-19 infection: 4-5-day history-cycle threshold 19.9.  No pneumonia but still with some URI symptoms.  Discussed MAB use with patient-he is interested-aware of risks-have asked pharmacy to dose 1 dose of MAB.  HLD: On  fenofibrate/statin.  HTN: BP reasonable-on nitroglycerin infusion-reassess to see if oral antihypertensives can be restarted post cath.  Obesity: Estimated body mass index is 32.12 kg/m as calculated from the following:   Height as of this encounter: 5\' 6"  (1.676 m).   Weight as of this encounter: 90.3 kg.   RN pressure injury documentation:   ABG: No results found for: PHART, PCO2ART, PO2ART, HCO3, TCO2, ACIDBASEDEF, O2SAT  Vent Settings: N/A    Condition - Stable  Family Communication  : Daughter at  bedside.  Code Status :  Full Code  Diet :  Diet Order            Diet NPO time specified  Diet effective ____           Diet Heart Room service appropriate? Yes; Fluid consistency: Thin  Diet effective now                  Disposition Plan  :   Status is: Inpatient  Remains inpatient appropriate because:Inpatient level of care appropriate due to severity of illness   Dispo: The patient is from: Home              Anticipated d/c is to: Home              Patient currently is not medically stable to d/c.   Difficult to place patient No    Barriers to discharge: Non-STEMI-for PCI later today-potential discharge on 5/20.  Antimicorbials  :    Anti-infectives (From admission, onward)   None      Inpatient Medications  Scheduled Meds: . aspirin EC  81 mg Oral Daily  . fenofibrate  160 mg Oral Daily  . meloxicam  15 mg Oral Daily  . nicotine  21 mg Transdermal Daily  . predniSONE  20 mg Oral Q breakfast  . rosuvastatin  40 mg Oral Daily  . sodium chloride flush  3 mL Intravenous Q12H  . sodium chloride flush  3 mL Intravenous Q12H  . ticagrelor  90 mg Oral BID   Continuous Infusions: . sodium chloride    . sodium chloride    . sodium chloride    . heparin 1,350 Units/hr (12/08/20 0201)  . nitroGLYCERIN 10 mcg/min (12/07/20 2156)   PRN Meds:.sodium chloride, sodium chloride, acetaminophen, nitroGLYCERIN, ondansetron (ZOFRAN) IV, sodium chloride  flush, sodium chloride flush, tiZANidine, zolpidem   Time Spent in minutes  35    See all Orders from today for further details   Oren Binet M.D on 12/08/2020 at 10:09 AM  To page go to [email or web address] - use universal password  [facility] -  Office  [phone]    Objective:   Vitals:   12/07/20 2122 12/07/20 2152 12/07/20 2222 12/08/20 0453  BP: 128/73 123/77 119/80 113/67  Pulse: 74 70 61 74  Resp:   15 16  Temp:      TempSrc:      SpO2: 94% 96% 98% 92%  Weight:      Height:        Wt Readings from Last 3 Encounters:  12/07/20 90.3 kg  10/03/18 94.5 kg  09/12/18 97.1 kg     Intake/Output Summary (Last 24 hours) at 12/08/2020 1009 Last data filed at 12/08/2020 0600 Gross per 24 hour  Intake 85.82 ml  Output --  Net 85.82 ml     Physical Exam Gen Exam:Alert awake-not in any distress HEENT:atraumatic, normocephalic Chest: B/L clear to auscultation anteriorly CVS:S1S2 regular Abdomen:soft non tender, non distended Extremities:no edema Neurology: Non focal Skin: no rash   Data Review:    CBC Recent Labs  Lab 12/07/20 0416 12/08/20 0752  WBC 6.5 6.4  HGB 16.9 14.6  HCT 47.8 42.8  PLT 173 157  MCV 86.4 88.2  MCH 30.6 30.1  MCHC 35.4 34.1  RDW 12.1 12.4  LYMPHSABS 2.3 1.5  MONOABS 0.9 0.8  EOSABS 0.0 0.0  BASOSABS 0.0 0.0    Chemistries  Recent Labs  Lab 12/07/20 0416 12/08/20 0752  NA  137 135  K 4.1 3.8  CL 105 106  CO2 23 21*  GLUCOSE 122* 130*  BUN 12 10  CREATININE 0.70 0.83  CALCIUM 8.9 8.6*  MG  --  1.8  AST 38 147*  ALT 30 43  ALKPHOS 52 36*  BILITOT 0.5 0.8   ------------------------------------------------------------------------------------------------------------------ Recent Labs    12/07/20 1207  CHOL 155  HDL 43  LDLCALC 98  TRIG 69  CHOLHDL 3.6    Lab Results  Component Value Date   HGBA1C 6.1 (H) 12/07/2020    ------------------------------------------------------------------------------------------------------------------ No results for input(s): TSH, T4TOTAL, T3FREE, THYROIDAB in the last 72 hours.  Invalid input(s): FREET3 ------------------------------------------------------------------------------------------------------------------ Recent Labs    12/07/20 1337 12/08/20 0752  FERRITIN 290 326    Coagulation profile No results for input(s): INR, PROTIME in the last 168 hours.  Recent Labs    12/07/20 1337 12/08/20 0752  DDIMER 0.36 <0.27    Cardiac Enzymes No results for input(s): CKMB, TROPONINI, MYOGLOBIN in the last 168 hours.  Invalid input(s): CK ------------------------------------------------------------------------------------------------------------------ No results found for: BNP  Micro Results Recent Results (from the past 240 hour(s))  Resp Panel by RT-PCR (Flu A&B, Covid) Nasopharyngeal Swab     Status: Abnormal   Collection Time: 12/07/20  6:01 AM   Specimen: Nasopharyngeal Swab; Nasopharyngeal(NP) swabs in vial transport medium  Result Value Ref Range Status   SARS Coronavirus 2 by RT PCR POSITIVE (A) NEGATIVE Final    Comment: RESULT CALLED TO, READ BACK BY AND VERIFIED WITH: KELLIE NEAL RN ON 12/07/20 AT 8676 [place] (NOTE) SARS-CoV-2 target nucleic acids are DETECTED.  The SARS-CoV-2 RNA is generally detectable in upper respiratory specimens during the acute phase of infection. Positive results are indicative of the presence of the identified virus, but do not rule out bacterial infection or co-infection with other pathogens not detected by the test. Clinical correlation with patient history and other diagnostic information is necessary to determine patient infection status. The expected result is Negative.  Fact Sheet for Patients: EntrepreneurPulse.com.au  Fact Sheet for Healthcare  Providers: IncredibleEmployment.be  This test is not yet approved or cleared by the [place] FDA and  has been authorized for detection and/or diagnosis of SARS-CoV-2 by FDA under an Emergency Use Authorization (EUA).  This EUA will remain in effect (meaning this test ca n be used) for the duration of  the COVID-19 declaration under Section 564(b)(1) of the Act, 21 U.S.C. section 360bbb-3(b)(1), unless the authorization is terminated or revoked sooner.     Influenza A by PCR NEGATIVE NEGATIVE Final   Influenza B by PCR NEGATIVE NEGATIVE Final    Comment: (NOTE) The Xpert Xpress SARS-CoV-2/FLU/RSV plus assay is intended as an aid in the diagnosis of influenza from Nasopharyngeal swab specimens and should not be used as a sole basis for treatment. Nasal washings and aspirates are unacceptable for Xpert Xpress SARS-CoV-2/FLU/RSV testing.  Fact Sheet for Patients: EntrepreneurPulse.com.au  Fact Sheet for Healthcare Providers: IncredibleEmployment.be  This test is not yet approved or cleared by the [place] FDA and has been authorized for detection and/or diagnosis of SARS-CoV-2 by FDA under an Emergency Use Authorization (EUA). This EUA will remain in effect (meaning this test can be used) for the duration of the COVID-19 declaration under Section 564(b)(1) of the Act, 21 U.S.C. section 360bbb-3(b)(1), unless the authorization is terminated or revoked.  Performed at [facility], [address], [place], [place] [address]     Radiology Reports DG Chest 1  View  Result Date: 12/07/2020 CLINICAL DATA:  50 year old male with chest pain. EXAM: CHEST  1 VIEW COMPARISON:  None. FINDINGS: Portable AP view at 0505 hours. Normal lung volumes and mediastinal contours. Visualized tracheal air column is within normal limits. Allowing for portable technique the lungs are clear. No pneumothorax. No osseous abnormality  identified. Negative visible bowel gas pattern. IMPRESSION: Negative portable chest. Electronically Signed   By: Genevie Ann M.D.   On: 12/07/2020 05:13   CARDIAC CATHETERIZATION  Result Date: 12/07/2020 LM: Normal LAD: Minimal luminal irregularities        Diag 1 ostial 50% stenosis Ramus: Ostial 40% stenosis LCx: Prox-mid tandem 90% stenoses RCA: Mid vessel occlusion        Left-to-right collaterals frill distal RCA Plan for staged intervention to LCx tomorrow. Resume IV heparin after TR band removal. Nigel Mormon, MD Pager: [phone] Office: [phone]   ECHOCARDIOGRAM COMPLETE  Result Date: 12/07/2020    ECHOCARDIOGRAM REPORT   Patient Name:   Randall Washington Date of Exam: 12/07/2020 Medical Rec #:  [id number]    Height:       66.0 in Accession #:    [id number]   Weight:       199.0 lb Date of Birth:  [birth year]    BSA:          1.996 m Patient Age:    34 years     BP:           120/86 mmHg Patient Gender: M            HR:           60 bpm. Exam Location:  Inpatient Procedure: 2D Echo, 3D Echo, Color Doppler, Cardiac Doppler and Strain Analysis Indications:     Chest Pain R07.9  History:         Patient has no prior history of Echocardiogram examinations.                  Risk Factors:Hypertension, Current Smoker and Dyslipidemia.                  COVID 19. GERD.  Sonographer:     Darlina Sicilian RDCS Referring Phys:  [id number] [facility] J PATWARDHAN Diagnosing Phys: Vernell Leep MD  Sonographer Comments: Suboptimal parasternal window. IMPRESSIONS  1. Left ventricular ejection fraction, by estimation, is 50 to 55%. The left ventricle has low normal function. The left ventricle has no regional wall motion abnormalities. There is mild left ventricular hypertrophy. Left ventricular diastolic parameters are consistent with Grade I diastolic dysfunction (impaired relaxation). The average left ventricular global longitudinal strain is -15.7 %, which is mildly reduced.  2. Right ventricular systolic function  is low normal. The right ventricular size is normal.  3. No significant valvular abnormality.  4. The inferior vena cava is normal in size with greater than 50% respiratory variability, suggesting right atrial pressure of 3 mmHg. FINDINGS  Left Ventricle: Left ventricular ejection fraction, by estimation, is 50 to 55%. The left ventricle has low normal function. The left ventricle has no regional wall motion abnormalities. The average left ventricular global longitudinal strain is -15.7 %. The global longitudinal strain is abnormal. The left ventricular internal cavity size was normal in size. There is mild left ventricular hypertrophy. Left ventricular diastolic parameters are consistent with Grade I diastolic dysfunction (impaired relaxation). Right Ventricle: The right ventricular size is normal. No increase in right ventricular wall thickness. Right ventricular systolic function is low normal. Left  Atrium: Left atrial size was normal in size. Right Atrium: Right atrial size was normal in size. Pericardium: There is no evidence of pericardial effusion. Mitral Valve: The mitral valve is grossly normal. No evidence of mitral valve regurgitation. Tricuspid Valve: The tricuspid valve is grossly normal. Tricuspid valve regurgitation is not demonstrated. Aortic Valve: The aortic valve is grossly normal. Aortic valve regurgitation is not visualized. Pulmonic Valve: The pulmonic valve was grossly normal. Pulmonic valve regurgitation is not visualized. Aorta: The aortic root and ascending aorta are structurally normal, with no evidence of dilitation. Venous: The inferior vena cava is normal in size with greater than 50% respiratory variability, suggesting right atrial pressure of 3 mmHg. IAS/Shunts: No atrial level shunt detected by color flow Doppler.  LEFT VENTRICLE PLAX 2D LVIDd:         4.50 cm  Diastology LVIDs:         3.10 cm  LV e' medial:    5.44 cm/s LV PW:         1.10 cm  LV E/e' medial:  13.3 LV IVS:         1.40 cm  LV e' lateral:   12.60 cm/s LVOT diam:     2.30 cm  LV E/e' lateral: 5.7 LV SV:         66 LV SV Index:   33       2D Longitudinal Strain LVOT Area:     4.15 cm 2D Strain GLS Avg:     -15.7 %  RIGHT VENTRICLE RV S prime:     8.16 cm/s TAPSE (M-mode): 1.3 cm LEFT ATRIUM             Index       RIGHT ATRIUM           Index LA diam:        3.30 cm 1.65 cm/m  RA Area:     10.80 cm LA Vol (A2C):   46.2 ml 23.15 ml/m RA Volume:   21.20 ml  10.62 ml/m LA Vol (A4C):   37.5 ml 18.79 ml/m LA Biplane Vol: 42.6 ml 21.35 ml/m  AORTIC VALVE LVOT Vmax:   82.20 cm/s LVOT Vmean:  60.100 cm/s LVOT VTI:    0.160 m  AORTA Ao Root diam: 3.40 cm MITRAL VALVE MV Area (PHT): 3.03 cm    SHUNTS MV Decel Time: 250 msec    Systemic VTI:  0.16 m MV E velocity: 72.40 cm/s  Systemic Diam: 2.30 cm MV A velocity: 69.00 cm/s MV E/A ratio:  1.05 Manish Patwardhan MD Electronically signed by Vernell Leep MD Signature Date/Time: 12/07/2020/2:05:22 PM    Final

## 2020-12-09 ENCOUNTER — Encounter (HOSPITAL_COMMUNITY): Payer: Self-pay | Admitting: Cardiology

## 2020-12-09 ENCOUNTER — Other Ambulatory Visit (HOSPITAL_COMMUNITY): Payer: Self-pay

## 2020-12-09 LAB — COMPREHENSIVE METABOLIC PANEL
ALT: 33 U/L (ref 0–44)
AST: 78 U/L — ABNORMAL HIGH (ref 15–41)
Albumin: 3.4 g/dL — ABNORMAL LOW (ref 3.5–5.0)
Alkaline Phosphatase: 35 U/L — ABNORMAL LOW (ref 38–126)
Anion gap: 8 (ref 5–15)
BUN: 9 mg/dL (ref 6–20)
CO2: 23 mmol/L (ref 22–32)
Calcium: 8.6 mg/dL — ABNORMAL LOW (ref 8.9–10.3)
Chloride: 105 mmol/L (ref 98–111)
Creatinine, Ser: 0.86 mg/dL (ref 0.61–1.24)
GFR, Estimated: >60 mL/min (ref >60)
Glucose, Bld: 143 mg/dL — ABNORMAL HIGH (ref 70–99)
Potassium: 3.5 mmol/L (ref 3.5–5.1)
Sodium: 136 mmol/L (ref 135–145)
Total Bilirubin: 0.4 mg/dL (ref 0.3–1.2)
Total Protein: 5.8 g/dL — ABNORMAL LOW (ref 6.5–8.1)

## 2020-12-09 LAB — CBC
HCT: 40.5 % (ref 39.0–52.0)
Hemoglobin: 14 g/dL (ref 13.0–17.0)
MCH: 30.4 pg (ref 26.0–34.0)
MCHC: 34.6 g/dL (ref 30.0–36.0)
MCV: 87.9 fL (ref 80.0–100.0)
Platelets: 151 10*3/uL (ref 150–400)
RBC: 4.61 MIL/uL (ref 4.22–5.81)
RDW: 12.4 % (ref 11.5–15.5)
WBC: 6.2 10*3/uL (ref 4.0–10.5)
nRBC: 0 % (ref 0.0–0.2)

## 2020-12-09 LAB — C-REACTIVE PROTEIN: CRP: 4.8 mg/dL — ABNORMAL HIGH (ref <1.0)

## 2020-12-09 MED ORDER — LOSARTAN POTASSIUM 50 MG PO TABS
50.0000 mg | ORAL_TABLET | Freq: Every day | ORAL | 0 refills | Status: DC
  Filled 2020-12-09: qty 60, 60d supply, fill #0

## 2020-12-09 MED FILL — Heparin Sod (Porcine)-NaCl IV Soln 1000 Unit/500ML-0.9%: INTRAVENOUS | Qty: 500 | Status: AC

## 2020-12-09 NOTE — Progress Notes (Signed)
CARDIAC REHAB PHASE I   Did not see pt face-to-face due to Covid restrictions. Left MI book, stent card, heart healthy diet and smoking cessation sheet. Attempted to call room, room phone busy. Phase 2 referral in place, will send to [facility] [place].  Rufina Falco, RN BSN 12/09/2020 9:03 AM

## 2020-12-09 NOTE — Discharge Summary (Signed)
PATIENT DETAILS Name: Randall Washington Age: 50 y.o. Sex: male Date of Birth: 1971-03-22 MRN: [id number]. Admitting Physician: Emeline GeneralPing T Zhang, MD WUJ:[id number]:Osei-Bonsu, Greggory StallionGeorge, MD  Admit Date: 12/07/2020 Discharge date: 12/09/2020  Recommendations for Outpatient Follow-up:  1. Follow up with PCP in 1-2 weeks 2. Please obtain CMP/CBC in one week   Admitted From:  Home   Disposition: Home   Home Health: No  Equipment/Devices: None  Discharge Condition: Stable  CODE STATUS: FULL CODE  Diet recommendation:  Diet Order            Diet - low sodium heart healthy           Diet Heart Room service appropriate? Yes; Fluid consistency: Thin  Diet effective now                  Brief Narrative: Patient is a 50 y.o. male with PMHx of HTN, HLD, who was diagnosed with COVID-19 on 5/15 (fever/weakness)-presenting with non-STEMI.  COVID-19 vaccinated status: Vaccinated including booster  Significant Events: 5/15>> diagnosed with COVID-19 5/18>> [facility]-found to have non-STEMI  Significant studies: 5/18>> chest x-ray: No pneumonia 5/18>> Echo: EF 50-55%, grade 1 diastolic dysfunction 5/18>> LHC: 90% proximal left circumflex lesion 5/19>> PCI to proximal mid left circumflex.  COVID-19 medications: 5/19>> MAB x1  Antibiotics: None  Microbiology data: Blood culture:  Procedures: 5/19>> PCI to proximal mid left circumflex. 5/18>> left heart catheterization: LM:      Normal LCx: Prox-mid tandem 90% stenoses RCA: Mid vessel occlusion Left-to-right collaterals frill distal RCA LAD:    Minimal luminal irregularities             Diag 1 ostial 50% stenosis         Ramus: Ostial 40% stenosis     Consults: Cards  Brief Hospital Course: Non-STEMI:  Evaluated by cardiology-underwent diagnostic left heart catheterization on 5/18 which showed culprit lesion in the left circumflex artery-on 5/19 underwent PCI.  No further chest  pain-reevaluated by cardiology on 5/20-okay for discharge.  Continue dual antiplatelet/statin/beta-blocker.  Follow-up with cardiology in the outpatient setting.  COVID-19 infection: 4-5-day history-cycle threshold 19.9.  No pneumonia but still with some URI symptoms.    Received 1 dose of monoclonal antibody on 5/19.  HLD: On fenofibrate/statin.  HTN: BP controlled-now on metoprolol and losartan.  Follow and optimize.  Obesity: Estimated body mass index is 32.12 kg/m as calculated from the following:   Height as of this encounter: 5\' 6"  (1.676 m).   Weight as of this encounter: 90.3 kg.    Discharge Diagnoses:  Active Problems:   NSTEMI (non-ST elevated myocardial infarction) (HCC)   COVID-19 virus infection   ACS (acute coronary syndrome) [facility](HCC)   Discharge Instructions:  Activity:  As tolerated   Discharge Instructions    AMB Referral to Cardiac Rehabilitation - Phase II   Complete by: As directed    Diagnosis:  Coronary Stents NSTEMI     After initial evaluation and assessments completed: Virtual Based Care may be provided alone or in conjunction with Phase 2 Cardiac Rehab based on patient barriers.: Yes   Call MD for:  difficulty breathing, headache or visual disturbances   Complete by: As directed    Diet - low sodium heart healthy   Complete by: As directed    Discharge instructions   Complete by: As directed    Follow with Primary MD  Jackie Plumsei-Bonsu, George, MD in 1-2 weeks  Follow with cardiology as instructed.  7 days of isolation from the day of your first positive test.  Please get a complete blood count and chemistry panel checked by your Primary MD at your next visit, and again as instructed by your Primary MD.  Get Medicines reviewed and adjusted: Please take all your medications with you for your next visit with your Primary MD  Laboratory/radiological data: Please request your Primary MD to go over all hospital tests and procedure/radiological  results at the follow up, please ask your Primary MD to get all Hospital records sent to his/her office.  In some cases, they will be blood work, cultures and biopsy results pending at the time of your discharge. Please request that your primary care M.D. follows up on these results.  Also Note the following: If you experience worsening of your admission symptoms, develop shortness of breath, life threatening emergency, suicidal or homicidal thoughts you must seek medical attention immediately by calling 911 or calling your MD immediately  if symptoms less severe.  You must read complete instructions/literature along with all the possible adverse reactions/side effects for all the Medicines you take and that have been prescribed to you. Take any new Medicines after you have completely understood and accpet all the possible adverse reactions/side effects.   Do not drive when taking Pain medications or sleeping medications (Benzodaizepines)  Do not take more than prescribed Pain, Sleep and Anxiety Medications. It is not advisable to combine anxiety,sleep and pain medications without talking with your primary care practitioner  Special Instructions: If you have smoked or chewed Tobacco  in the last 2 yrs please stop smoking, stop any regular Alcohol  and or any Recreational drug use.  Wear Seat belts while driving.  Please note: You were cared for by a hospitalist during your hospital stay. Once you are discharged, your primary care physician will handle any further medical issues. Please note that NO REFILLS for any discharge medications will be authorized once you are discharged, as it is imperative that you return to your primary care physician (or establish a relationship with a primary care physician if you do not have one) for your post hospital discharge needs so that they can reassess your need for medications and monitor your lab values.   Increase activity slowly   Complete by: As  directed      Allergies as of 12/09/2020   No Known Allergies     Medication List    STOP taking these medications   azithromycin 250 MG tablet Commonly known as: ZITHROMAX   meloxicam 15 MG tablet Commonly known as: MOBIC     TAKE these medications   acetaminophen 325 MG tablet Commonly known as: TYLENOL Take 650 mg by mouth every 6 (six) hours as needed for mild pain.   aspirin 81 MG EC tablet Take 1 tablet (81 mg total) by mouth daily. Swallow whole.   fenofibrate 48 MG tablet Commonly known as: TRICOR Take 96 mg by mouth daily.   losartan 100 MG tablet Commonly known as: COZAAR Take 0.5 tablets (50 mg total) by mouth daily. What changed: how much to take   metoprolol succinate 25 MG 24 hr tablet Commonly known as: TOPROL-XL Take 0.5 tablets (12.5 mg total) by mouth daily.   nitroGLYCERIN 0.4 MG SL tablet Commonly known as: NITROSTAT Place 1 tablet (0.4 mg total) under the tongue every 5 (five) minutes as needed for chest pain.   predniSONE 20 MG tablet Commonly known as: DELTASONE Take 20 mg by mouth daily  with breakfast.   rosuvastatin 20 MG tablet Commonly known as: CRESTOR Take 1 tablet (20 mg total) by mouth daily.   simethicone 125 MG chewable tablet Commonly known as: MYLICON Chew 0000000 mg by mouth every 6 (six) hours as needed for flatulence.   ticagrelor 90 MG Tabs tablet Commonly known as: Brilinta Take 1 tablet (90 mg total) by mouth 2 (two) times daily.   tiZANidine 4 MG tablet Commonly known as: ZANAFLEX Take 4 mg by mouth every 8 (eight) hours as needed for muscle spasms.       Follow-up Information    Nigel Mormon, MD Follow up on 12/22/2020.   Specialties: Cardiology, Radiology Why: 10 AM Contact information: [place] [address] [phone]        Benito Mccreedy, MD. Schedule an appointment as soon as possible for a visit.   Specialty: Internal Medicine Contact  information: [id number] [place]  [address] [phone]              No Known Allergies     Other Procedures/Studies: DG Chest 1 View  Result Date: 12/07/2020 CLINICAL DATA:  50 year old male with chest pain. EXAM: CHEST  1 VIEW COMPARISON:  None. FINDINGS: Portable AP view at 0505 hours. Normal lung volumes and mediastinal contours. Visualized tracheal air column is within normal limits. Allowing for portable technique the lungs are clear. No pneumothorax. No osseous abnormality identified. Negative visible bowel gas pattern. IMPRESSION: Negative portable chest. Electronically Signed   By: Genevie Ann M.D.   On: 12/07/2020 05:13   CARDIAC CATHETERIZATION  Result Date: 12/08/2020 LM: Normal LAD: Minimal luminal irregularities        Diag 1 ostial 50% stenosis Ramus: Ostial 40% stenosis LCx: Prox-mid tandem 90% stenoses, followed by a 40% stenosis RCA: Angiogram performed on 12/07/2020        Mid vessel occlusion        Left-to-right collaterals fiill distal RCA         Successful percutaneous coronary intervention Prox-Mid LCx        PTCA and overlapping stents placement        2.75 X 26 mm Resolute drug-eluting stent        2.5 X 15 mm Resolute drug-eluting stent 0% residual stenosis TIMI flow II-->III Nigel Mormon, MD Pager: [phone] Office: [phone]   CARDIAC CATHETERIZATION  Result Date: 12/07/2020 LM: Normal LAD: Minimal luminal irregularities        Diag 1 ostial 50% stenosis Ramus: Ostial 40% stenosis LCx: Prox-mid tandem 90% stenoses RCA: Mid vessel occlusion        Left-to-right collaterals frill distal RCA Plan for staged intervention to LCx tomorrow. Resume IV heparin after TR band removal. Nigel Mormon, MD Pager: [phone] Office: [phone]   ECHOCARDIOGRAM COMPLETE  Result Date: 12/07/2020    ECHOCARDIOGRAM REPORT   Patient Name:   YSRAEL BOGHOSIAN Date of Exam: 12/07/2020 Medical Rec #:  [id number]    Height:       66.0 in Accession  #:    [id number]   Weight:       199.0 lb Date of Birth:  Jun 21, 1971    BSA:          1.996 m Patient Age:    50 years     BP:           120/86 mmHg Patient Gender: M            HR:  60 bpm. Exam Location:  Inpatient Procedure: 2D Echo, 3D Echo, Color Doppler, Cardiac Doppler and Strain Analysis Indications:     Chest Pain R07.9  History:         Patient has no prior history of Echocardiogram examinations.                  Risk Factors:Hypertension, Current Smoker and Dyslipidemia.                  COVID 19. GERD.  Sonographer:     Darlina Sicilian RDCS Referring Phys:  [id number] [facility] J PATWARDHAN Diagnosing Phys: Vernell Leep MD  Sonographer Comments: Suboptimal parasternal window. IMPRESSIONS  1. Left ventricular ejection fraction, by estimation, is 50 to 55%. The left ventricle has low normal function. The left ventricle has no regional wall motion abnormalities. There is mild left ventricular hypertrophy. Left ventricular diastolic parameters are consistent with Grade I diastolic dysfunction (impaired relaxation). The average left ventricular global longitudinal strain is -15.7 %, which is mildly reduced.  2. Right ventricular systolic function is low normal. The right ventricular size is normal.  3. No significant valvular abnormality.  4. The inferior vena cava is normal in size with greater than 50% respiratory variability, suggesting right atrial pressure of 3 mmHg. FINDINGS  Left Ventricle: Left ventricular ejection fraction, by estimation, is 50 to 55%. The left ventricle has low normal function. The left ventricle has no regional wall motion abnormalities. The average left ventricular global longitudinal strain is -15.7 %. The global longitudinal strain is abnormal. The left ventricular internal cavity size was normal in size. There is mild left ventricular hypertrophy. Left ventricular diastolic parameters are consistent with Grade I diastolic dysfunction (impaired relaxation). Right  Ventricle: The right ventricular size is normal. No increase in right ventricular wall thickness. Right ventricular systolic function is low normal. Left Atrium: Left atrial size was normal in size. Right Atrium: Right atrial size was normal in size. Pericardium: There is no evidence of pericardial effusion. Mitral Valve: The mitral valve is grossly normal. No evidence of mitral valve regurgitation. Tricuspid Valve: The tricuspid valve is grossly normal. Tricuspid valve regurgitation is not demonstrated. Aortic Valve: The aortic valve is grossly normal. Aortic valve regurgitation is not visualized. Pulmonic Valve: The pulmonic valve was grossly normal. Pulmonic valve regurgitation is not visualized. Aorta: The aortic root and ascending aorta are structurally normal, with no evidence of dilitation. Venous: The inferior vena cava is normal in size with greater than 50% respiratory variability, suggesting right atrial pressure of 3 mmHg. IAS/Shunts: No atrial level shunt detected by color flow Doppler.  LEFT VENTRICLE PLAX 2D LVIDd:         4.50 cm  Diastology LVIDs:         3.10 cm  LV e' medial:    5.44 cm/s LV PW:         1.10 cm  LV E/e' medial:  13.3 LV IVS:        1.40 cm  LV e' lateral:   12.60 cm/s LVOT diam:     2.30 cm  LV E/e' lateral: 5.7 LV SV:         66 LV SV Index:   33       2D Longitudinal Strain LVOT Area:     4.15 cm 2D Strain GLS Avg:     -15.7 %  RIGHT VENTRICLE RV S prime:     8.16 cm/s TAPSE (M-mode): 1.3 cm LEFT ATRIUM  Index       RIGHT ATRIUM           Index LA diam:        3.30 cm 1.65 cm/m  RA Area:     10.80 cm LA Vol (A2C):   46.2 ml 23.15 ml/m RA Volume:   21.20 ml  10.62 ml/m LA Vol (A4C):   37.5 ml 18.79 ml/m LA Biplane Vol: 42.6 ml 21.35 ml/m  AORTIC VALVE LVOT Vmax:   82.20 cm/s LVOT Vmean:  60.100 cm/s LVOT VTI:    0.160 m  AORTA Ao Root diam: 3.40 cm MITRAL VALVE MV Area (PHT): 3.03 cm    SHUNTS MV Decel Time: 250 msec    Systemic VTI:  0.16 m MV E velocity:  72.40 cm/s  Systemic Diam: 2.30 cm MV A velocity: 69.00 cm/s MV E/A ratio:  1.05 Manish Patwardhan MD Electronically signed by Vernell Leep MD Signature Date/Time: 12/07/2020/2:05:22 PM    Final       TODAY-DAY OF DISCHARGE:  Subjective:   Javar Dell today has no headache,no chest abdominal pain,no new weakness tingling or numbness, feels much better wants to go home today.   Objective:   Blood pressure 119/71, pulse 82, temperature 98.5 F (36.9 C), temperature source Oral, resp. rate 16, height 5\' 6"  (1.676 m), weight 90.3 kg, SpO2 95 %. No intake or output data in the 24 hours ending 12/09/20 0852 Filed Weights   12/07/20 0409  Weight: 90.3 kg    Exam: Awake Alert, Oriented *3, No new F.N deficits, Normal affect [place].AT,PERRAL Supple Neck,No JVD, No cervical lymphadenopathy appriciated.  Symmetrical Chest wall movement, Good air movement bilaterally, CTAB RRR,No Gallops,Rubs or new Murmurs, No Parasternal Heave +ve B.Sounds, Abd Soft, Non tender, No organomegaly appriciated, No rebound -guarding or rigidity. No Cyanosis, Clubbing or edema, No new Rash or bruise   PERTINENT RADIOLOGIC STUDIES: CARDIAC CATHETERIZATION  Result Date: 12/08/2020 LM: Normal LAD: Minimal luminal irregularities        Diag 1 ostial 50% stenosis Ramus: Ostial 40% stenosis LCx: Prox-mid tandem 90% stenoses, followed by a 40% stenosis RCA: Angiogram performed on 12/07/2020        Mid vessel occlusion        Left-to-right collaterals fiill distal RCA         Successful percutaneous coronary intervention Prox-Mid LCx        PTCA and overlapping stents placement        2.75 X 26 mm Resolute drug-eluting stent        2.5 X 15 mm Resolute drug-eluting stent 0% residual stenosis TIMI flow II-->III Nigel Mormon, MD Pager: [phone] Office: [phone]   CARDIAC CATHETERIZATION  Result Date: 12/07/2020 LM: Normal LAD: Minimal luminal irregularities        Diag 1 ostial 50% stenosis Ramus: Ostial  40% stenosis LCx: Prox-mid tandem 90% stenoses RCA: Mid vessel occlusion        Left-to-right collaterals frill distal RCA Plan for staged intervention to LCx tomorrow. Resume IV heparin after TR band removal. Nigel Mormon, MD Pager: [phone] Office: [phone]   ECHOCARDIOGRAM COMPLETE  Result Date: 12/07/2020    ECHOCARDIOGRAM REPORT   Patient Name:   WEYMAN BOGDON Date of Exam: 12/07/2020 Medical Rec #:  [id number]    Height:       66.0 in Accession #:    [id number]   Weight:       199.0 lb Date of Birth:  12/12/1970    BSA:  1.996 m Patient Age:    50 years     BP:           120/86 mmHg Patient Gender: M            HR:           60 bpm. Exam Location:  Inpatient Procedure: 2D Echo, 3D Echo, Color Doppler, Cardiac Doppler and Strain Analysis Indications:     Chest Pain R07.9  History:         Patient has no prior history of Echocardiogram examinations.                  Risk Factors:Hypertension, Current Smoker and Dyslipidemia.                  COVID 19. GERD.  Sonographer:     Darlina Sicilian RDCS Referring Phys:  [id number] [facility] J PATWARDHAN Diagnosing Phys: Vernell Leep MD  Sonographer Comments: Suboptimal parasternal window. IMPRESSIONS  1. Left ventricular ejection fraction, by estimation, is 50 to 55%. The left ventricle has low normal function. The left ventricle has no regional wall motion abnormalities. There is mild left ventricular hypertrophy. Left ventricular diastolic parameters are consistent with Grade I diastolic dysfunction (impaired relaxation). The average left ventricular global longitudinal strain is -15.7 %, which is mildly reduced.  2. Right ventricular systolic function is low normal. The right ventricular size is normal.  3. No significant valvular abnormality.  4. The inferior vena cava is normal in size with greater than 50% respiratory variability, suggesting right atrial pressure of 3 mmHg. FINDINGS  Left Ventricle: Left ventricular ejection fraction, by  estimation, is 50 to 55%. The left ventricle has low normal function. The left ventricle has no regional wall motion abnormalities. The average left ventricular global longitudinal strain is -15.7 %. The global longitudinal strain is abnormal. The left ventricular internal cavity size was normal in size. There is mild left ventricular hypertrophy. Left ventricular diastolic parameters are consistent with Grade I diastolic dysfunction (impaired relaxation). Right Ventricle: The right ventricular size is normal. No increase in right ventricular wall thickness. Right ventricular systolic function is low normal. Left Atrium: Left atrial size was normal in size. Right Atrium: Right atrial size was normal in size. Pericardium: There is no evidence of pericardial effusion. Mitral Valve: The mitral valve is grossly normal. No evidence of mitral valve regurgitation. Tricuspid Valve: The tricuspid valve is grossly normal. Tricuspid valve regurgitation is not demonstrated. Aortic Valve: The aortic valve is grossly normal. Aortic valve regurgitation is not visualized. Pulmonic Valve: The pulmonic valve was grossly normal. Pulmonic valve regurgitation is not visualized. Aorta: The aortic root and ascending aorta are structurally normal, with no evidence of dilitation. Venous: The inferior vena cava is normal in size with greater than 50% respiratory variability, suggesting right atrial pressure of 3 mmHg. IAS/Shunts: No atrial level shunt detected by color flow Doppler.  LEFT VENTRICLE PLAX 2D LVIDd:         4.50 cm  Diastology LVIDs:         3.10 cm  LV e' medial:    5.44 cm/s LV PW:         1.10 cm  LV E/e' medial:  13.3 LV IVS:        1.40 cm  LV e' lateral:   12.60 cm/s LVOT diam:     2.30 cm  LV E/e' lateral: 5.7 LV SV:         66 LV SV Index:  33       2D Longitudinal Strain LVOT Area:     4.15 cm 2D Strain GLS Avg:     -15.7 %  RIGHT VENTRICLE RV S prime:     8.16 cm/s TAPSE (M-mode): 1.3 cm LEFT ATRIUM              Index       RIGHT ATRIUM           Index LA diam:        3.30 cm 1.65 cm/m  RA Area:     10.80 cm LA Vol (A2C):   46.2 ml 23.15 ml/m RA Volume:   21.20 ml  10.62 ml/m LA Vol (A4C):   37.5 ml 18.79 ml/m LA Biplane Vol: 42.6 ml 21.35 ml/m  AORTIC VALVE LVOT Vmax:   82.20 cm/s LVOT Vmean:  60.100 cm/s LVOT VTI:    0.160 m  AORTA Ao Root diam: 3.40 cm MITRAL VALVE MV Area (PHT): 3.03 cm    SHUNTS MV Decel Time: 250 msec    Systemic VTI:  0.16 m MV E velocity: 72.40 cm/s  Systemic Diam: 2.30 cm MV A velocity: 69.00 cm/s MV E/A ratio:  1.05 Manish Patwardhan MD Electronically signed by Vernell Leep MD Signature Date/Time: 12/07/2020/2:05:22 PM    Final      PERTINENT LAB RESULTS: CBC: Recent Labs    12/08/20 0752 12/09/20 0244  WBC 6.4 6.2  HGB 14.6 14.0  HCT 42.8 40.5  PLT 157 151   CMET CMP     Component Value Date/Time   NA 136 12/09/2020 0244   NA 142 10/01/2018 0843   K 3.5 12/09/2020 0244   CL 105 12/09/2020 0244   CO2 23 12/09/2020 0244   GLUCOSE 143 (H) 12/09/2020 0244   BUN 9 12/09/2020 0244   BUN 17 10/01/2018 0843   CREATININE 0.86 12/09/2020 0244   CALCIUM 8.6 (L) 12/09/2020 0244   PROT 5.8 (L) 12/09/2020 0244   ALBUMIN 3.4 (L) 12/09/2020 0244   AST 78 (H) 12/09/2020 0244   ALT 33 12/09/2020 0244   ALKPHOS 35 (L) 12/09/2020 0244   BILITOT 0.4 12/09/2020 0244   GFRNONAA >60 12/09/2020 0244   GFRAA 125 10/01/2018 0843    GFR Estimated Creatinine Clearance: 108.1 mL/min (by C-G formula based on SCr of 0.86 mg/dL). No results for input(s): LIPASE, AMYLASE in the last 72 hours. No results for input(s): CKTOTAL, CKMB, CKMBINDEX, TROPONINI in the last 72 hours. Invalid input(s): POCBNP Recent Labs    12/07/20 1337 12/08/20 0752  DDIMER 0.36 <0.27   Recent Labs    12/07/20 1207  HGBA1C 6.1*   Recent Labs    12/07/20 1207  CHOL 155  HDL 43  LDLCALC 98  TRIG 69  CHOLHDL 3.6   No results for input(s): TSH, T4TOTAL, T3FREE, THYROIDAB in the last 72  hours.  Invalid input(s): FREET3 Recent Labs    12/07/20 1337 12/08/20 0752  FERRITIN 290 326   Coags: No results for input(s): INR in the last 72 hours.  Invalid input(s): PT Microbiology: Recent Results (from the past 240 hour(s))  Resp Panel by RT-PCR (Flu A&B, Covid) Nasopharyngeal Swab     Status: Abnormal   Collection Time: 12/07/20  6:01 AM   Specimen: Nasopharyngeal Swab; Nasopharyngeal(NP) swabs in vial transport medium  Result Value Ref Range Status   SARS Coronavirus 2 by RT PCR POSITIVE (A) NEGATIVE Final    Comment: RESULT CALLED TO, READ BACK BY AND VERIFIED  WITH: Marsa Aris RN ON 12/07/20 AT [id number] [place] (NOTE) SARS-CoV-2 target nucleic acids are DETECTED.  The SARS-CoV-2 RNA is generally detectable in upper respiratory specimens during the acute phase of infection. Positive results are indicative of the presence of the identified virus, but do not rule out bacterial infection or co-infection with other pathogens not detected by the test. Clinical correlation with patient history and other diagnostic information is necessary to determine patient infection status. The expected result is Negative.  Fact Sheet for Patients: EntrepreneurPulse.com.au  Fact Sheet for Healthcare Providers: IncredibleEmployment.be  This test is not yet approved or cleared by the [place] FDA and  has been authorized for detection and/or diagnosis of SARS-CoV-2 by FDA under an Emergency Use Authorization (EUA).  This EUA will remain in effect (meaning this test ca n be used) for the duration of  the COVID-19 declaration under Section 564(b)(1) of the Act, 21 U.S.C. section 360bbb-3(b)(1), unless the authorization is terminated or revoked sooner.     Influenza A by PCR NEGATIVE NEGATIVE Final   Influenza B by PCR NEGATIVE NEGATIVE Final    Comment: (NOTE) The Xpert Xpress SARS-CoV-2/FLU/RSV plus assay is intended as an aid in the diagnosis  of influenza from Nasopharyngeal swab specimens and should not be used as a sole basis for treatment. Nasal washings and aspirates are unacceptable for Xpert Xpress SARS-CoV-2/FLU/RSV testing.  Fact Sheet for Patients: EntrepreneurPulse.com.au  Fact Sheet for Healthcare Providers: IncredibleEmployment.be  This test is not yet approved or cleared by the [place] FDA and has been authorized for detection and/or diagnosis of SARS-CoV-2 by FDA under an Emergency Use Authorization (EUA). This EUA will remain in effect (meaning this test can be used) for the duration of the COVID-19 declaration under Section 564(b)(1) of the Act, 21 U.S.C. section 360bbb-3(b)(1), unless the authorization is terminated or revoked.  Performed at [facility], [address] [place], [place] [address]     FURTHER DISCHARGE INSTRUCTIONS:  Get Medicines reviewed and adjusted: Please take all your medications with you for your next visit with your Primary MD  Laboratory/radiological data: Please request your Primary MD to go over all hospital tests and procedure/radiological results at the follow up, please ask your Primary MD to get all Hospital records sent to his/her office.  In some cases, they will be blood work, cultures and biopsy results pending at the time of your discharge. Please request that your primary care M.D. goes through all the records of your hospital data and follows up on these results.  Also Note the following: If you experience worsening of your admission symptoms, develop shortness of breath, life threatening emergency, suicidal or homicidal thoughts you must seek medical attention immediately by calling 911 or calling your MD immediately  if symptoms less severe.  You must read complete instructions/literature along with all the possible adverse reactions/side effects for all the Medicines you take and that have been prescribed to  you. Take any new Medicines after you have completely understood and accpet all the possible adverse reactions/side effects.   Do not drive when taking Pain medications or sleeping medications (Benzodaizepines)  Do not take more than prescribed Pain, Sleep and Anxiety Medications. It is not advisable to combine anxiety,sleep and pain medications without talking with your primary care practitioner  Special Instructions: If you have smoked or chewed Tobacco  in the last 2 yrs please stop smoking, stop any regular Alcohol  and or any Recreational drug use.  Wear Seat  belts while driving.  Please note: You were cared for by a hospitalist during your hospital stay. Once you are discharged, your primary care physician will handle any further medical issues. Please note that NO REFILLS for any discharge medications will be authorized once you are discharged, as it is imperative that you return to your primary care physician (or establish a relationship with a primary care physician if you do not have one) for your post hospital discharge needs so that they can reassess your need for medications and monitor your lab values.  Total Time spent coordinating discharge including counseling, education and face to face time equals 35 minutes.  SignedOren Binet 12/09/2020 8:52 AM

## 2020-12-09 NOTE — Progress Notes (Signed)
Subjective:  No chesr pain/ shortness of breath  Objective:  Vital Signs in the last 24 hours: Temp:  [97.5 F (36.4 C)-98.5 F (36.9 C)] 98.5 F (36.9 C) (05/19 2030) Pulse Rate:  [78-83] 82 (05/19 2300) Resp:  [16-18] 16 (05/19 2300) BP: (119-130)/(71-81) 119/71 (05/19 2300) SpO2:  [95 %-100 %] 95 % (05/19 2300)  Intake/Output from previous day: No intake/output data recorded.  Physical Exam Vitals and nursing note reviewed.  Constitutional:      General: He is not in acute distress.    Appearance: He is well-developed.  HENT:     Head: Normocephalic and atraumatic.  Eyes:     Conjunctiva/sclera: Conjunctivae normal.     Pupils: Pupils are equal, round, and reactive to light.  Neck:     Vascular: No JVD.  Cardiovascular:     Rate and Rhythm: Normal rate and regular rhythm.     Pulses: Normal pulses and intact distal pulses.     Heart sounds: No murmur heard.   Pulmonary:     Effort: Pulmonary effort is normal.     Breath sounds: Normal breath sounds. No wheezing or rales.  Abdominal:     General: Bowel sounds are normal.     Palpations: Abdomen is soft.     Tenderness: There is no rebound.  Musculoskeletal:        General: No tenderness. Normal range of motion.     Right lower leg: No edema.     Left lower leg: No edema.  Lymphadenopathy:     Cervical: No cervical adenopathy.  Skin:    General: Skin is warm and dry.  Neurological:     Mental Status: He is alert and oriented to person, place, and time.     Cranial Nerves: No cranial nerve deficit.      Lab Results: BMP Recent Labs    12/07/20 0416 12/08/20 0752 12/09/20 0244  NA 137 135 136  K 4.1 3.8 3.5  CL 105 106 105  CO2 23 21* 23  GLUCOSE 122* 130* 143*  BUN 12 10 9   CREATININE 0.70 0.83 0.86  CALCIUM 8.9 8.6* 8.6*  GFRNONAA >60 >60 >60    CBC Recent Labs  Lab 12/08/20 0752 12/09/20 0244  WBC 6.4 6.2  RBC 4.85 4.61  HGB 14.6 14.0  HCT 42.8 40.5  PLT 157 151  MCV 88.2 87.9   MCH 30.1 30.4  MCHC 34.1 34.6  RDW 12.4 12.4  LYMPHSABS 1.5  --   MONOABS 0.8  --   EOSABS 0.0  --   BASOSABS 0.0  --     HEMOGLOBIN A1C Lab Results  Component Value Date   HGBA1C 6.1 (H) 12/07/2020   MPG 128.37 12/07/2020    Cardiac Panel (last 3 results) Results for ZI, [place] (MRN [id number]) as of 12/09/2020 11:35  Ref. Range 12/07/2020 04:16 12/07/2020 06:18  Troponin I (High Sensitivity) Latest Ref Range: <18 ng/L 891 (HH) 3,861 (HH)   BNP (last 3 results) N/A  TSH N/A  Lipid Panel     Component Value Date/Time   CHOL 155 12/07/2020 1207   CHOL 132 10/01/2018 0843   TRIG 69 12/07/2020 1207   HDL 43 12/07/2020 1207   HDL 44 10/01/2018 0843   CHOLHDL 3.6 12/07/2020 1207   VLDL 14 12/07/2020 1207   LDLCALC 98 12/07/2020 1207   LDLCALC 65 10/01/2018 0843     Hepatic Function Panel Recent Labs    12/07/20 0416 12/08/20 0752 12/09/20 0244  PROT 7.6 6.2* 5.8*  ALBUMIN 4.4 3.7 3.4*  AST 38 147* 78*  ALT 30 43 33  ALKPHOS 52 36* 35*  BILITOT 0.5 0.8 0.4    Cardiac Studies:  EKG 12/08/2020: Sinus rhythm Inferior infarct, age indeterminate  Coronary intervention 12/08/2020: LM: Normal LAD: Minimal luminal irregularities Diag 1 ostial 50% stenosis Ramus: Ostial 40% stenosis LCx: Prox-mid tandem 90% stenoses, followed by a 40% stenosis RCA: Angiogram performed on 12/07/2020        Mid vessel occlusion Left-to-right collaterals fiill distal RCA         Successful percutaneous coronary intervention Prox-Mid LCx        PTCA and overlapping stents placement         2.75 X 26 mm Resolute drug-eluting stent        2.5 X 15 mm Resolute drug-eluting stent  0% residual stenosis TIMI flow II-->III   Echocardiogram 12/07/2020: 1. Left ventricular ejection fraction, by estimation, is 50 to 55%. The  left ventricle has low normal function. The left ventricle has no regional  wall motion abnormalities. There is mild left ventricular  hypertrophy.  Left ventricular diastolic  parameters are consistent with Grade I diastolic dysfunction (impaired  relaxation). The average left ventricular global longitudinal strain is  -15.7 %, which is mildly reduced.  2. Right ventricular systolic function is low normal. The right  ventricular size is normal.  3. No significant valvular abnormality.  4. The inferior vena cava is normal in size with greater than 50%  respiratory variability, suggesting right atrial pressure of 3 mmHg.   Assessment & Recommendations:  50 y/o [place] man with hypertension, hyperlipidemia, tobacco abuse, strong family h/o CAD,with chest pain, COVID positive  Non-STEMI: Culprit LCx. Successful intervention mid LCx 2.75 X 26 mm Resolute drug-eluting stent, 2.5 X 15 mm Resolute drug-eluting stent RCA CTO non-culprit Recommend DAPT with Aspirin and Brilinta at least till 11/2021 Recommend metoprolol succinate 12.5 mg daily, losartan 1/2 of 50 mg daily, Crestor 20 mg daily.  F/u on 12/22/2020   Nigel Mormon, MD Pager: [phone] Office: [phone]

## 2020-12-09 NOTE — Plan of Care (Signed)
Patient is ready for discharge. Transportation has been arranged and is outside the main hospital entrance. AVS has been discussed in great detail with the patient and all questions answered to include medication questions. Patient provided with education on new meds and advised on what to expect over the next 7 days while he remains quarantined. All IV's removed personal belongings returned and patient transported by Probation officer to his car where his daughter is waiting to carry him home. Telemetry Box removed, cleaned, and turned in. Patient Discharged.   Problem: Education: Goal: Knowledge of General Education information will improve Description: Including pain rating scale, medication(s)/side effects and non-pharmacologic comfort measures Outcome: Adequate for Discharge   Problem: Health Behavior/Discharge Planning: Goal: Ability to manage health-related needs will improve Outcome: Adequate for Discharge   Problem: Clinical Measurements: Goal: Will remain free from infection Outcome: Adequate for Discharge Goal: Diagnostic test results will improve Outcome: Adequate for Discharge Goal: Cardiovascular complication will be avoided Outcome: Adequate for Discharge   Problem: Activity: Goal: Risk for activity intolerance will decrease Outcome: Adequate for Discharge   Problem: Nutrition: Goal: Adequate nutrition will be maintained Outcome: Adequate for Discharge   Problem: Coping: Goal: Level of anxiety will decrease Outcome: Adequate for Discharge   Problem: Pain Managment: Goal: General experience of comfort will improve Outcome: Adequate for Discharge   Problem: Safety: Goal: Ability to remain free from injury will improve Outcome: Adequate for Discharge

## 2020-12-09 NOTE — Progress Notes (Signed)
CARDIAC REHAB PHASE I   Attempted several more times throughout the morning to reach pt via telephone. Phone off the hook or kept ringing. CRP II or placed, materials left with pts nurse.  Rufina Falco, RN BSN 12/09/2020 11:20 AM

## 2020-12-12 ENCOUNTER — Telehealth: Payer: Self-pay

## 2020-12-13 ENCOUNTER — Telehealth (HOSPITAL_COMMUNITY): Payer: Self-pay | Admitting: Pharmacist

## 2020-12-14 ENCOUNTER — Other Ambulatory Visit (HOSPITAL_COMMUNITY): Payer: Self-pay

## 2020-12-14 ENCOUNTER — Telehealth (HOSPITAL_COMMUNITY): Payer: Self-pay

## 2020-12-14 NOTE — Telephone Encounter (Signed)
Transitions of Care Pharmacy   Call attempted for a pharmacy transitions of care follow-up. HIPAA appropriate voicemail was left with call back information provided.   Call attempt #3, will no longer attempt follow up calls for Randall Washington

## 2020-12-20 NOTE — Telephone Encounter (Signed)
done

## 2020-12-22 ENCOUNTER — Encounter: Payer: Self-pay | Admitting: Cardiology

## 2020-12-22 ENCOUNTER — Other Ambulatory Visit: Payer: Self-pay

## 2020-12-22 ENCOUNTER — Ambulatory Visit: Payer: Medicaid Other | Admitting: Cardiology

## 2020-12-22 VITALS — BP 126/80 | HR 81 | Temp 97.8°F | Resp 16 | Ht 66.0 in | Wt 200.2 lb

## 2020-12-22 DIAGNOSIS — I2510 Atherosclerotic heart disease of native coronary artery without angina pectoris: Secondary | ICD-10-CM | POA: Insufficient documentation

## 2020-12-22 DIAGNOSIS — I10 Essential (primary) hypertension: Secondary | ICD-10-CM

## 2020-12-22 MED ORDER — LOSARTAN POTASSIUM 25 MG PO TABS: 25.0000 mg | ORAL_TABLET | Freq: Every day | ORAL | 3 refills | Status: DC

## 2020-12-22 MED ORDER — ASPIRIN 81 MG PO TBEC: 81.0000 mg | DELAYED_RELEASE_TABLET | Freq: Every day | ORAL | 3 refills | Status: AC

## 2020-12-22 MED ORDER — METOPROLOL SUCCINATE ER 25 MG PO TB24: 25.0000 mg | ORAL_TABLET | Freq: Every day | ORAL | 3 refills | Status: DC

## 2020-12-22 MED ORDER — ROSUVASTATIN CALCIUM 20 MG PO TABS: 20.0000 mg | ORAL_TABLET | Freq: Every day | ORAL | 3 refills | Status: DC

## 2020-12-22 MED ORDER — TICAGRELOR 90 MG PO TABS: 90.0000 mg | ORAL_TABLET | Freq: Two times a day (BID) | ORAL | 2 refills | Status: DC

## 2020-12-22 NOTE — Progress Notes (Signed)
Follow up visit  Subjective:   Randall Washington, male    DOB: 07/17/1971, 50 y.o.   MRN: [id number]   HPI  Chief Complaint  Patient presents with  . Coronary artery disease involving native coronary artery of  . Follow-up  . Post cath     50y/o [place] man with hypertension, hyperlipidemia, nicotine dependence, CAD with NSTEMI 11/2020, h/o COVID positive (11/2020)  Patient was admitted with chest pain and NSTMEI in 11/2018. He incidentally was found to be COVID positive without any symptoms. He was treated with monoclonal antibody. Cath showed RCA CTO (non-culprit) and culprit stenosis in CLx. He underwent successful PCI.  He is doing well and denies any exertional chest pain. He has occasional twinge in his chest lasting for a second or two. He is walking without any symptoms, but feels fatigued.   He has quit smoking.      Current Outpatient Medications on File Prior to Visit  Medication Sig Dispense Refill  . aspirin 81 MG EC tablet Take 1 tablet (81 mg total) by mouth daily. Swallow whole. 30 tablet 11  . fenofibrate (TRICOR) 48 MG tablet Take 96 mg by mouth daily.    Marland Kitchen losartan (COZAAR) 50 MG tablet Take 1 tablet (50 mg total) by mouth daily. 60 tablet 0  . metoprolol succinate (TOPROL-XL) 25 MG 24 hr tablet Take 0.5 tablets (12.5 mg total) by mouth daily. 30 tablet 2  . nitroGLYCERIN (NITROSTAT) 0.4 MG SL tablet Place 1 tablet (0.4 mg total) under the tongue every 5 (five) minutes as needed for chest pain. 30 tablet 3  . predniSONE (DELTASONE) 20 MG tablet Take 20 mg by mouth daily with breakfast.    . rosuvastatin (CRESTOR) 20 MG tablet Take 1 tablet (20 mg total) by mouth daily. 30 tablet 1  . ticagrelor (BRILINTA) 90 MG TABS tablet Take 1 tablet (90 mg total) by mouth 2 (two) times daily. 60 tablet 2   No current facility-administered medications on file prior to visit.    Cardiovascular & other pertient studies:  EKG 12/22/2020: Sinus rhythm 68 bpm Old inferior  infarct  Coronary intervention 12/08/2020: LM: Normal LAD: Minimal luminal irregularities Diag 1 ostial 50% stenosis Ramus: Ostial 40% stenosis LCx: Prox-mid tandem 90% stenoses, followed by a 40% stenosis RCA: Angiogram performed on 12/07/2020 Mid vessel occlusion Left-to-right collaterals fiill distal RCA  Successful percutaneous coronary intervention Prox-Mid LCx PTCA and overlapping stents placement  2.75 X 26 mm Resolute drug-eluting stent 2.5 X 15 mm Resolute drug-eluting stent  0% residual stenosis TIMI flow II-->III   Coronary angiography 12/07/2020: LM: Normal LAD: Minimal luminal irregularities Diag 1 ostial 50% stenosis Ramus: Ostial 40% stenosis LCx: Prox-mid tandem 90% stenoses RCA: Mid vessel occlusion    Left-to-right collaterals frill distal RCA           Attempted wiring, aborted due to chronic and non-culprit nature of the lesion  Plan for staged intervention to LCx tomorrow. Resume IV heparin after TR band removal.   Echocardiogram 12/07/2020: 1. Left ventricular ejection fraction, by estimation, is 50 to 55%. The  left ventricle has low normal function. The left ventricle has no regional  wall motion abnormalities. There is mild left ventricular hypertrophy.  Left ventricular diastolic  parameters are consistent with Grade I diastolic dysfunction (impaired  relaxation). The average left ventricular global longitudinal strain is  -15.7 %, which is mildly reduced.  2. Right ventricular systolic function is low normal. The right  ventricular size is normal.  3. No significant valvular abnormality.  4. The inferior vena cava is normal in size with greater than 50%  respiratory variability, suggesting right atrial pressure of 3 mmHg.    Recent labs:  12/09/2020: Glucose 143, BUN/Cr 9/0.86. EGFR >60. Na/K 136/3.5. H/H 14/40. MCV 87. Platelets 151 HbA1C 6.1% Chol 155, TG 69, HDL  43, LDL 98 TSH N/A Results for ROBLEY, MATASSA (MRN [id number]) as of 12/22/2020 11:45  Ref. Range 12/08/2020 07:52 12/09/2020 02:44  Alkaline Phosphatase Latest Ref Range: 38 - 126 U/L 36 (L) 35 (L)  Albumin Latest Ref Range: 3.5 - 5.0 g/dL 3.7 3.4 (L)  AST Latest Ref Range: 15 - 41 U/L 147 (H) 78 (H)  ALT Latest Ref Range: 0 - 44 U/L 43 33  Total Protein Latest Ref Range: 6.5 - 8.1 g/dL 6.2 (L) 5.8 (L)     Review of Systems  Constitutional: Positive for malaise/fatigue.  Cardiovascular: Positive for chest pain (AS per HPI). Negative for dyspnea on exertion, leg swelling, palpitations and syncope.         Vitals:   12/22/20 1148  BP: 126/80  Pulse: 81  Resp: 16  Temp: 97.8 F (36.6 C)  SpO2: 95%    Body mass index is 32.31 kg/m. Filed Weights   12/22/20 1148  Weight: 200 lb 3.2 oz (90.8 kg)     Objective:   Physical Exam Vitals and nursing note reviewed.  Constitutional:      General: He is not in acute distress. Neck:     Vascular: No JVD.  Cardiovascular:     Rate and Rhythm: Normal rate and regular rhythm.     Heart sounds: Normal heart sounds. No murmur heard.   Pulmonary:     Effort: Pulmonary effort is normal.     Breath sounds: Normal breath sounds. No wheezing or rales.  Musculoskeletal:     Right lower leg: No edema.     Left lower leg: No edema.           Assessment & Recommendations:   50y/o [place] man with hypertension, hyperlipidemia, nicotine dependence, CAD with NSTEMI 11/2020, h/o COVID positive (11/2020)  CAD: Currently, no angina symptoms NSTEMI 11/2020 Culprit LCx. Successful intervention mid LCx2.75 X 26 mm Resolute drug-eluting stent,2.5 X 15 mm Resolute drug-eluting stent RCA CTO non-culprit Recommend DAPT with Aspirin and Brilinta at least till 11/2021 Recommend metoprolol succinate 25 mg daily, losartan 25 mg daily, Crestor 20 mg daily. Okay to continue fenofibrate which he was previously on I congratulated him on  quitting smoking. 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  Hypertension: Controlled  I think his focal chest pain lasting for few seconds is non-anginal. Fatigue could be related to metoprolol or OSA. Recommend cardiac rehab. If fatigue not improved, could consider sleep study or stopping metoprolol.  F/u in 3 months after lipid panel    Nigel Mormon, MD Pager: [phone] Office: [phone]

## 2020-12-22 NOTE — Progress Notes (Signed)
Error

## 2021-02-28 ENCOUNTER — Telehealth: Payer: Self-pay | Admitting: Cardiology

## 2021-02-28 ENCOUNTER — Telehealth: Payer: Self-pay

## 2021-02-28 NOTE — Telephone Encounter (Signed)
No contraindication to these meds from cardiac standpoint.  Thanks MJP

## 2021-02-28 NOTE — Telephone Encounter (Signed)
Randall Washington w/Dr. Rich Fuchs office called in regards to this Pt. Wanted to discuss a few medications for Pt to take w/med assistant. Her phone number is [phone].

## 2021-02-28 NOTE — Telephone Encounter (Signed)
Pt ortho Dr. Griffin Basil called pt c/o neck pain and they want to prescribe the following  Robaxin Gabapentin Medrol dose pack Want your approval before sending in meds.

## 2021-02-28 NOTE — Telephone Encounter (Signed)
Dr. Rich Fuchs office aware

## 2021-03-23 NOTE — Progress Notes (Signed)
Follow up visit  Subjective:   Randall Washington, male    DOB: 1970/11/09, 50 y.o.   MRN: [id number]   HPI  Chief Complaint  Patient presents with   Essential hypertension   Coronary artery disease involving native coronary artery of    3 month    50 y/o [place] man with hypertension, hyperlipidemia, nicotine dependence, CAD with NSTEMI 11/2020, h/o COVID positive (11/2020)  Patient is doing well, denies chest pain, shortness of breath, palpitations, leg edema, orthopnea, PND, TIA/syncope. Blood pressure is elevated today. He was recently treated with solumedrol for back and shoulder pain, but has finished the treatment a few days ago.    Current Outpatient Medications on File Prior to Visit  Medication Sig Dispense Refill   aspirin 81 MG EC tablet Take 1 tablet (81 mg total) by mouth daily. Swallow whole. 90 tablet 3   fenofibrate (TRICOR) 48 MG tablet Take 96 mg by mouth daily.     losartan (COZAAR) 25 MG tablet Take 1 tablet (25 mg total) by mouth daily. 90 tablet 3   metoprolol succinate (TOPROL-XL) 25 MG 24 hr tablet Take 1 tablet (25 mg total) by mouth daily. 90 tablet 3   nitroGLYCERIN (NITROSTAT) 0.4 MG SL tablet Place 1 tablet (0.4 mg total) under the tongue every 5 (five) minutes as needed for chest pain. 30 tablet 3   ticagrelor (BRILINTA) 90 MG TABS tablet Take 1 tablet (90 mg total) by mouth 2 (two) times daily. 180 tablet 2   No current facility-administered medications on file prior to visit.    Cardiovascular & other pertient studies:  EKG 12/22/2020: Sinus rhythm 68 bpm Old inferior infarct   Coronary intervention 12/08/2020: LM: Normal LAD: Minimal luminal irregularities        Diag 1 ostial 50% stenosis Ramus: Ostial 40% stenosis LCx: Prox-mid tandem 90% stenoses, followed by a 40% stenosis RCA: Angiogram performed on 12/07/2020        Mid vessel occlusion        Left-to-right collaterals fiill distal RCA          Successful percutaneous coronary  intervention Prox-Mid LCx        PTCA and overlapping stents placement         2.75 X 26 mm Resolute drug-eluting stent        2.5 X 15 mm Resolute drug-eluting stent   0% residual stenosis TIMI flow II-->III     Coronary angiography 12/07/2020: LM: Normal LAD: Minimal luminal irregularities        Diag 1 ostial 50% stenosis Ramus: Ostial 40% stenosis LCx: Prox-mid tandem 90% stenoses RCA: Mid vessel occlusion           Left-to-right collaterals frill distal RCA           Attempted wiring, aborted due to chronic and non-culprit nature of the lesion   Plan for staged intervention to LCx tomorrow. Resume IV heparin after TR band removal.    Echocardiogram 12/07/2020:  1. Left ventricular ejection fraction, by estimation, is 50 to 55%. The  left ventricle has low normal function. The left ventricle has no regional  wall motion abnormalities. There is mild left ventricular hypertrophy.  Left ventricular diastolic  parameters are consistent with Grade I diastolic dysfunction (impaired  relaxation). The average left ventricular global longitudinal strain is  -15.7 %, which is mildly reduced.   2. Right ventricular systolic function is low normal. The right  ventricular size is normal.   3.  No significant valvular abnormality.   4. The inferior vena cava is normal in size with greater than 50%  respiratory variability, suggesting right atrial pressure of 3 mmHg.      Recent labs:  12/09/2020: Glucose 143, BUN/Cr 9/0.86. EGFR >60. Na/K 136/3.5. H/H 14/40. MCV 87. Platelets 151 HbA1C 6.1% Chol 155, TG 69, HDL 43, LDL 98 TSH N/A Results for Randall Washington, Randall Washington (MRN [id number]) as of 12/22/2020 11:45  Ref. Range 12/08/2020 07:52 12/09/2020 02:44  Alkaline Phosphatase Latest Ref Range: 38 - 126 U/L 36 (L) 35 (L)  Albumin Latest Ref Range: 3.5 - 5.0 g/dL 3.7 3.4 (L)  AST Latest Ref Range: 15 - 41 U/L 147 (H) 78 (H)  ALT Latest Ref Range: 0 - 44 U/L 43 33  Total Protein Latest Ref Range: 6.5 -  8.1 g/dL 6.2 (L) 5.8 (L)     Review of Systems  Constitutional: Positive for malaise/fatigue.  Cardiovascular:  Positive for chest pain (AS per HPI). Negative for dyspnea on exertion, leg swelling, palpitations and syncope.        Vitals:   03/24/21 1027 03/24/21 1029  BP: (!) 150/95 (!) 144/91  Pulse: 78 76  Resp: 17   Temp: 98.5 F (36.9 C)   SpO2: 97% 98%    Body mass index is 33.89 kg/m. Filed Weights   03/24/21 1027  Weight: 210 lb (95.3 kg)     Objective:   Physical Exam Vitals and nursing note reviewed.  Constitutional:      General: He is not in acute distress. Neck:     Vascular: No JVD.  Cardiovascular:     Rate and Rhythm: Normal rate and regular rhythm.     Heart sounds: Normal heart sounds. No murmur heard. Pulmonary:     Effort: Pulmonary effort is normal.     Breath sounds: Normal breath sounds. No wheezing or rales.  Musculoskeletal:     Right lower leg: No edema.     Left lower leg: No edema.          Assessment & Recommendations:   50 y/o [place] man with hypertension, hyperlipidemia, nicotine dependence, CAD with NSTEMI 11/2020, h/o COVID positive (11/2020)   CAD: Currently, no angina symptoms NSTEMI 11/2020 Culprit LCx. Successful intervention mid LCx 2.75 X 26 mm Resolute drug-eluting stent, 2.5 X 15 mm Resolute drug-eluting stent RCA CTO non-culprit Recommend DAPT with Aspirin and Brilinta at least till 11/2021  Recommend metoprolol succinate 25 mg daily, Crestor 20 mg daily. Increased losartan to 50 mg daily Check lipid panel and CMP in one week. Okay to continue fenofibrate which he was previously on  Hypertension: As above  F/u in 6 months   Nigel Mormon, MD Pager: [phone] Office: [phone]

## 2021-03-24 ENCOUNTER — Ambulatory Visit: Payer: Medicaid Other | Admitting: Cardiology

## 2021-03-24 ENCOUNTER — Other Ambulatory Visit: Payer: Self-pay

## 2021-03-24 ENCOUNTER — Encounter: Payer: Self-pay | Admitting: Cardiology

## 2021-03-24 VITALS — BP 144/91 | HR 76 | Temp 98.5°F | Resp 17 | Ht 66.0 in | Wt 210.0 lb

## 2021-03-24 DIAGNOSIS — I10 Essential (primary) hypertension: Secondary | ICD-10-CM

## 2021-03-24 DIAGNOSIS — I2510 Atherosclerotic heart disease of native coronary artery without angina pectoris: Secondary | ICD-10-CM

## 2021-03-24 MED ORDER — LOSARTAN POTASSIUM 50 MG PO TABS: 25.0000 mg | ORAL_TABLET | Freq: Every day | ORAL | 3 refills | Status: DC

## 2021-04-04 ENCOUNTER — Encounter (HOSPITAL_BASED_OUTPATIENT_CLINIC_OR_DEPARTMENT_OTHER): Payer: Self-pay | Admitting: *Deleted

## 2021-04-04 ENCOUNTER — Emergency Department (HOSPITAL_BASED_OUTPATIENT_CLINIC_OR_DEPARTMENT_OTHER)
Admission: EM | Admit: 2021-04-04 | Discharge: 2021-04-05 | Disposition: A | Discharge status: Home / Self Care | Payer: Medicaid Other | Attending: Emergency Medicine | Admitting: Emergency Medicine

## 2021-04-04 ENCOUNTER — Emergency Department (HOSPITAL_BASED_OUTPATIENT_CLINIC_OR_DEPARTMENT_OTHER): Payer: Medicaid Other

## 2021-04-04 ENCOUNTER — Other Ambulatory Visit: Payer: Self-pay

## 2021-04-04 DIAGNOSIS — I2510 Atherosclerotic heart disease of native coronary artery without angina pectoris: Secondary | ICD-10-CM | POA: Diagnosis not present

## 2021-04-04 DIAGNOSIS — Z7984 Long term (current) use of oral hypoglycemic drugs: Secondary | ICD-10-CM | POA: Diagnosis not present

## 2021-04-04 DIAGNOSIS — Z8616 Personal history of COVID-19: Secondary | ICD-10-CM | POA: Diagnosis not present

## 2021-04-04 DIAGNOSIS — R109 Unspecified abdominal pain: Secondary | ICD-10-CM | POA: Insufficient documentation

## 2021-04-04 DIAGNOSIS — I10 Essential (primary) hypertension: Secondary | ICD-10-CM | POA: Diagnosis not present

## 2021-04-04 DIAGNOSIS — F17210 Nicotine dependence, cigarettes, uncomplicated: Secondary | ICD-10-CM | POA: Insufficient documentation

## 2021-04-04 DIAGNOSIS — Z7982 Long term (current) use of aspirin: Secondary | ICD-10-CM | POA: Diagnosis not present

## 2021-04-04 DIAGNOSIS — K219 Gastro-esophageal reflux disease without esophagitis: Secondary | ICD-10-CM | POA: Insufficient documentation

## 2021-04-04 DIAGNOSIS — N200 Calculus of kidney: Secondary | ICD-10-CM | POA: Diagnosis not present

## 2021-04-04 DIAGNOSIS — Z79899 Other long term (current) drug therapy: Secondary | ICD-10-CM | POA: Diagnosis not present

## 2021-04-04 DIAGNOSIS — E1165 Type 2 diabetes mellitus with hyperglycemia: Secondary | ICD-10-CM | POA: Diagnosis not present

## 2021-04-04 DIAGNOSIS — R300 Dysuria: Secondary | ICD-10-CM | POA: Diagnosis present

## 2021-04-04 LAB — URINALYSIS, MICROSCOPIC (REFLEX): WBC, UA: NONE SEEN WBC/hpf (ref 0–5)

## 2021-04-04 LAB — URINALYSIS, ROUTINE W REFLEX MICROSCOPIC
Bilirubin Urine: NEGATIVE
Glucose, UA: >=500 mg/dL — AB
Hgb urine dipstick: NEGATIVE
Ketones, ur: NEGATIVE mg/dL
Leukocytes,Ua: NEGATIVE
Nitrite: NEGATIVE
Protein, ur: NEGATIVE mg/dL
Specific Gravity, Urine: 1.015 (ref 1.005–1.030)
pH: 6 (ref 5.0–8.0)

## 2021-04-04 NOTE — ED Triage Notes (Signed)
C/o lower back pain and lower abd pressure with dysuria x 1 week

## 2021-04-05 ENCOUNTER — Encounter (HOSPITAL_BASED_OUTPATIENT_CLINIC_OR_DEPARTMENT_OTHER): Payer: Self-pay | Admitting: Emergency Medicine

## 2021-04-05 LAB — BASIC METABOLIC PANEL
Anion gap: 8 (ref 5–15)
BUN: 15 mg/dL (ref 6–20)
CO2: 24 mmol/L (ref 22–32)
Calcium: 8.8 mg/dL — ABNORMAL LOW (ref 8.9–10.3)
Chloride: 104 mmol/L (ref 98–111)
Creatinine, Ser: 0.7 mg/dL (ref 0.61–1.24)
GFR, Estimated: >60 mL/min (ref >60)
Glucose, Bld: 284 mg/dL — ABNORMAL HIGH (ref 70–99)
Potassium: 3.8 mmol/L (ref 3.5–5.1)
Sodium: 136 mmol/L (ref 135–145)

## 2021-04-05 MED ORDER — FENTANYL CITRATE PF 50 MCG/ML IJ SOSY
50.0000 ug | PREFILLED_SYRINGE | Freq: Once | INTRAMUSCULAR | Status: AC
  Administered 2021-04-05: 50 ug via INTRAVENOUS
  Filled 2021-04-05: qty 1

## 2021-04-05 MED ORDER — METFORMIN HCL 500 MG PO TABS
500.0000 mg | ORAL_TABLET | Freq: Two times a day (BID) | ORAL | 0 refills | Status: DC
Start: 2021-04-05 — End: 2022-04-11

## 2021-04-05 MED ORDER — TRAMADOL HCL 50 MG PO TABS
50.0000 mg | ORAL_TABLET | Freq: Once | ORAL | Status: AC
  Administered 2021-04-05: 50 mg via ORAL
  Filled 2021-04-05: qty 1

## 2021-04-05 MED ORDER — TAMSULOSIN HCL 0.4 MG PO CAPS
0.4000 mg | ORAL_CAPSULE | ORAL | Status: AC
  Administered 2021-04-05: 0.4 mg via ORAL
  Filled 2021-04-05: qty 1

## 2021-04-05 MED ORDER — DICLOFENAC SODIUM ER 100 MG PO TB24: 100.0000 mg | ORAL_TABLET | Freq: Every day | ORAL | 0 refills | Status: DC

## 2021-04-05 MED ORDER — KETOROLAC TROMETHAMINE 30 MG/ML IJ SOLN
30.0000 mg | Freq: Once | INTRAMUSCULAR | Status: AC
  Administered 2021-04-05: 30 mg via INTRAVENOUS
  Filled 2021-04-05: qty 1

## 2021-04-05 MED ORDER — ONDANSETRON HCL 4 MG PO TABS: 4.0000 mg | ORAL_TABLET | Freq: Three times a day (TID) | ORAL | 0 refills | Status: DC | PRN

## 2021-04-05 MED ORDER — TAMSULOSIN HCL 0.4 MG PO CAPS: 0.4000 mg | ORAL_CAPSULE | Freq: Every day | ORAL | 0 refills | Status: DC

## 2021-04-05 MED ORDER — METFORMIN HCL 500 MG PO TABS
500.0000 mg | ORAL_TABLET | ORAL | Status: AC
  Administered 2021-04-05: 500 mg via ORAL
  Filled 2021-04-05: qty 1

## 2021-04-05 NOTE — ED Notes (Signed)
Pt declined interpreter - states he wants his daughter to interpret for him. D/c instructions reviewed with patient and daughter and state they verbalize understanding.

## 2021-04-05 NOTE — ED Provider Notes (Signed)
[facility] EMERGENCY DEPARTMENT Provider Note   CSN: [id number] Arrival date & time: 04/04/21  2054     History Chief Complaint  Patient presents with   Dysuria    Randall Washington is a 50 y.o. male.  The history is provided by the patient and a relative.  Dysuria Presenting symptoms: dysuria   Context: spontaneously   Relieved by:  Nothing Worsened by:  Nothing Ineffective treatments:  None tried Associated symptoms: abdominal pain   Associated symptoms: no fever, no hematuria, no nausea, no urinary hesitation, no urinary incontinence, no urinary retention and no vomiting   Risk factors: no kidney stones   Patient was sent in for a week of abdominal pain and dysuria.  No f/c/r.  No n/v/d.      Past Medical History:  Diagnosis Date   Depression    GERD (gastroesophageal reflux disease)    Hyperlipidemia    Hypertension    Neuromuscular disorder (HCC)    neck and shoulder pain right side down to fingers and numbess, steriod shots   Tobacco abuse     Patient Active Problem List   Diagnosis Date Noted   Coronary artery disease involving native coronary artery of native heart without angina pectoris 12/22/2020   NSTEMI (non-ST elevated myocardial infarction) ([place]) 12/07/2020   COVID-19 virus infection 12/07/2020   ACS (acute coronary syndrome) ([place])    Essential hypertension 09/12/2018   Tobacco abuse 09/12/2018   Exertional chest pain 09/12/2018   Exertional dyspnea 09/12/2018    Past Surgical History:  Procedure Laterality Date   CORONARY STENT INTERVENTION N/A 12/08/2020   Procedure: CORONARY STENT INTERVENTION;  Surgeon: Nigel Mormon, MD;  Location: [facility];  Service: Cardiovascular;  Laterality: N/A;   LEFT HEART CATH AND CORONARY ANGIOGRAPHY N/A 12/07/2020   Procedure: LEFT HEART CATH AND CORONARY ANGIOGRAPHY;  Surgeon: Nigel Mormon, MD;  Location: [facility];  Service: Cardiovascular;  Laterality: N/A;   WISDOM TOOTH  EXTRACTION         Family History  Problem Relation Age of Onset   Heart disease Father    Heart attack Father 24   Heart disease Brother    Heart disease Brother    Heart disease Brother    Heart disease Brother     Social History   Tobacco Use   Smoking status: Every Day    Packs/day: 1.00    Years: 30.00    Pack years: 30.00    Types: Cigarettes   Smokeless tobacco: Never   Tobacco comments:    will review   Vaping Use   Vaping Use: Never used  Substance Use Topics   Alcohol use: Never   Drug use: Never    Home Medications Prior to Admission medications   Medication Sig Start Date End Date Taking? Authorizing Provider  metFORMIN (GLUCOPHAGE) 500 MG tablet Take 1 tablet (500 mg total) by mouth 2 (two) times daily with a meal. 04/05/21  Yes Lillianna Sabel, MD  ondansetron (ZOFRAN) 4 MG tablet Take 1 tablet (4 mg total) by mouth every 8 (eight) hours as needed for nausea or vomiting. 04/05/21  Yes Marcellous Snarski, MD  tamsulosin (FLOMAX) 0.4 MG CAPS capsule Take 1 capsule (0.4 mg total) by mouth daily. 04/05/21  Yes Nathaneil Feagans, MD  aspirin 81 MG EC tablet Take 1 tablet (81 mg total) by mouth daily. Swallow whole. 12/22/20   Patwardhan, Reynold Bowen, MD  fenofibrate (TRICOR) 48 MG tablet Take 96 mg  by mouth daily. 11/30/20   [provider]  losartan (COZAAR) 50 MG tablet Take 0.5 tablets (25 mg total) by mouth daily. 03/24/21 06/22/21  Patwardhan, Reynold Bowen, MD  metoprolol succinate (TOPROL-XL) 25 MG 24 hr tablet Take 1 tablet (25 mg total) by mouth daily. 12/22/20   Patwardhan, Reynold Bowen, MD  nitroGLYCERIN (NITROSTAT) 0.4 MG SL tablet Place 1 tablet (0.4 mg total) under the tongue every 5 (five) minutes as needed for chest pain. 12/08/20 03/24/21  Patwardhan, Reynold Bowen, MD  rosuvastatin (CRESTOR) 20 MG tablet Take 20 mg by mouth daily.    [provider]  ticagrelor (BRILINTA) 90 MG TABS tablet Take 1 tablet (90 mg total) by mouth 2 (two) times daily. 12/22/20    Patwardhan, Reynold Bowen, MD    Allergies    Patient has no known allergies.  Review of Systems   Review of Systems  Constitutional:  Negative for fever.  HENT:  Negative for drooling.   Eyes:  Negative for redness.  Respiratory:  Negative for shortness of breath.   Cardiovascular:  Negative for leg swelling.  Gastrointestinal:  Positive for abdominal pain. Negative for nausea and vomiting.  Genitourinary:  Positive for dysuria. Negative for bladder incontinence, hematuria and hesitancy.  Musculoskeletal:  Negative for neck stiffness.  Skin:  Negative for rash.  Neurological:  Negative for dizziness.  Psychiatric/Behavioral:  Negative for agitation.   All other systems reviewed and are negative.  Physical Exam Updated Vital Signs BP 139/78   Pulse 66   Temp 98.1 F (36.7 C) (Oral)   Resp 16   Ht 5' 4.96" (1.65 m)   Wt 95.3 kg   SpO2 98%   BMI 34.99 kg/m   Physical Exam Vitals and nursing note reviewed.  Constitutional:      General: He is not in acute distress.    Appearance: Normal appearance.  HENT:     Head: Normocephalic and atraumatic.     Nose: Nose normal.  Eyes:     Conjunctiva/sclera: Conjunctivae normal.     Pupils: Pupils are equal, round, and reactive to light.  Cardiovascular:     Rate and Rhythm: Normal rate and regular rhythm.     Pulses: Normal pulses.     Heart sounds: Normal heart sounds.  Pulmonary:     Effort: Pulmonary effort is normal.     Breath sounds: Normal breath sounds.  Abdominal:     General: Abdomen is flat. Bowel sounds are normal.     Palpations: Abdomen is soft.     Tenderness: There is no abdominal tenderness. There is no guarding or rebound.  Musculoskeletal:        General: Normal range of motion.     Cervical back: Normal range of motion and neck supple.  Skin:    General: Skin is warm and dry.     Capillary Refill: Capillary refill takes less than 2 seconds.  Neurological:     General: No focal deficit present.      Mental Status: He is alert and oriented to person, place, and time.     Deep Tendon Reflexes: Reflexes normal.  Psychiatric:        Mood and Affect: Mood normal.        Behavior: Behavior normal.    ED Results / Procedures / Treatments   Labs (all labs ordered are listed, but only abnormal results are displayed) Labs Reviewed  URINALYSIS, ROUTINE W REFLEX MICROSCOPIC - Abnormal; Notable for the following components:  Result Value   Glucose, UA >=500 (*)    All other components within normal limits  URINALYSIS, MICROSCOPIC (REFLEX) - Abnormal; Notable for the following components:   Bacteria, UA RARE (*)    All other components within normal limits  BASIC METABOLIC PANEL - Abnormal; Notable for the following components:   Glucose, Bld 284 (*)    Calcium 8.8 (*)    All other components within normal limits    EKG None  Radiology CT Renal Stone Study  Result Date: 04/05/2021 CLINICAL DATA:  Left flank pain and abdominal pain.  Dysuria. EXAM: CT ABDOMEN AND PELVIS WITHOUT CONTRAST TECHNIQUE: Multidetector CT imaging of the abdomen and pelvis was performed following the standard protocol without IV contrast. COMPARISON:  None. FINDINGS: Lower chest: No acute abnormality. Hepatobiliary: The liver is mildly enlarged. Gallbladder and bile ducts are within normal limits. Pancreas: Unremarkable. No pancreatic ductal dilatation or surrounding inflammatory changes. Spleen: Normal in size without focal abnormality. Adrenals/Urinary Tract: There is a 7 mm calculus at the level of the distal left ureter. There is no hydronephrosis. The kidneys otherwise appear normal. Bladder appears within normal limits. Stomach/Bowel: Stomach is within normal limits. Appendix appears normal. No evidence of bowel wall thickening, distention, or inflammatory changes. Vascular/Lymphatic: Aortic atherosclerosis. No enlarged abdominal or pelvic lymph nodes. Reproductive: Prostate is unremarkable. Other: There is a  small fat containing umbilical hernia. No abdominopelvic ascites. Musculoskeletal: Degenerative changes affect the spine. IMPRESSION: 1. There is a 7 mm calculus at the level of the distal left ureter. There is no hydronephrosis. 2. Mild hepatomegaly. Electronically Signed   By: Ronney Asters M.D.   On: 04/05/2021 00:03    Procedures Procedures   Medications Ordered in ED Medications  tamsulosin (FLOMAX) capsule 0.4 mg (0.4 mg Oral Given 04/05/21 0055)  ketorolac (TORADOL) 30 MG/ML injection 30 mg (30 mg Intravenous Given 04/05/21 0100)  traMADol (ULTRAM) tablet 50 mg (50 mg Oral Given 04/05/21 0055)  metFORMIN (GLUCOPHAGE) tablet 500 mg (500 mg Oral Given 04/05/21 0140)  fentaNYL (SUBLIMAZE) injection 50 mcg (50 mcg Intravenous Given 04/05/21 0147)    ED Course  I have reviewed the triage vital signs and the nursing notes.  Pertinent labs & imaging results that were available during my care of the patient were reviewed by me and considered in my medical decision making (see chart for details).   Kidney stone, strain all urine take medications as prescribed and follow up with urology for ongoing care.  Newly diagnosed diabetes, low carb diet and start metformin and follow up immediately with PMD for ongoing testing and education.    Randall Washington was evaluated in Emergency Department on 04/05/2021 for the symptoms described in the history of present illness. He was evaluated in the context of the global COVID-19 pandemic, which necessitated consideration that the patient might be at risk for infection with the SARS-CoV-2 virus that causes COVID-19. Institutional protocols and algorithms that pertain to the evaluation of patients at risk for COVID-19 are in a state of rapid change based on information released by regulatory bodies including the CDC and federal and state organizations. These policies and algorithms were followed during the patient's care in the ED.  Final Clinical Impression(s) / ED  Diagnoses Final diagnoses:  Kidney stone  Hyperglycemia due to diabetes mellitus ([place])     Return for intractable cough, coughing up blood, fevers > 100.4 unrelieved by medication, shortness of breath, intractable vomiting, chest pain, shortness of breath, weakness, numbness, changes in speech,  facial asymmetry, abdominal pain, passing out, Inability to tolerate liquids or food, cough, altered mental status or any concerns. No signs of systemic illness or infection. The patient is nontoxic-appearing on exam and vital signs are within normal limits. I have reviewed the triage vital signs and the nursing notes. Pertinent labs & imaging results that were available during my care of the patient were reviewed by me and considered in my medical decision making (see chart for details). After history, exam, and medical workup I feel the patient has been appropriately medically screened and is safe for discharge home. Pertinent diagnoses were discussed with the patient. Patient was given return precautions.  Rx / DC Orders     Ladd Cen, MD 04/05/21 [phone]

## 2021-04-05 NOTE — ED Notes (Signed)
Strainer given to patient

## 2021-04-06 LAB — COMPREHENSIVE METABOLIC PANEL
ALT: 30 IU/L (ref 0–44)
AST: 20 IU/L (ref 0–40)
Albumin/Globulin Ratio: 2.3 — ABNORMAL HIGH (ref 1.2–2.2)
Albumin: 4.6 g/dL (ref 4.0–5.0)
Alkaline Phosphatase: 77 IU/L (ref 44–121)
BUN/Creatinine Ratio: 18 (ref 9–20)
BUN: 14 mg/dL (ref 6–24)
Bilirubin Total: 0.5 mg/dL (ref 0.0–1.2)
CO2: 23 mmol/L (ref 20–29)
Calcium: 9.2 mg/dL (ref 8.7–10.2)
Chloride: 102 mmol/L (ref 96–106)
Creatinine, Ser: 0.78 mg/dL (ref 0.76–1.27)
Globulin, Total: 2 g/dL (ref 1.5–4.5)
Glucose: 127 mg/dL — ABNORMAL HIGH (ref 65–99)
Potassium: 4.2 mmol/L (ref 3.5–5.2)
Sodium: 138 mmol/L (ref 134–144)
Total Protein: 6.6 g/dL (ref 6.0–8.5)
eGFR: 109 mL/min/{1.73_m2} (ref >59)

## 2021-04-06 LAB — LIPID PANEL
Chol/HDL Ratio: 2.8 ratio (ref 0.0–5.0)
Cholesterol, Total: 131 mg/dL (ref 100–199)
HDL: 46 mg/dL (ref >39)
LDL Chol Calc (NIH): 58 mg/dL (ref 0–99)
Triglycerides: 159 mg/dL — ABNORMAL HIGH (ref 0–149)
VLDL Cholesterol Cal: 27 mg/dL (ref 5–40)

## 2021-04-10 ENCOUNTER — Other Ambulatory Visit: Payer: Self-pay | Admitting: Urology

## 2021-04-10 DIAGNOSIS — N201 Calculus of ureter: Secondary | ICD-10-CM

## 2021-04-17 ENCOUNTER — Ambulatory Visit (HOSPITAL_BASED_OUTPATIENT_CLINIC_OR_DEPARTMENT_OTHER): Admit: 2021-04-17 | Payer: Medicaid Other | Admitting: Urology

## 2021-04-17 ENCOUNTER — Encounter (HOSPITAL_BASED_OUTPATIENT_CLINIC_OR_DEPARTMENT_OTHER): Payer: Self-pay

## 2021-04-17 SURGERY — LITHOTRIPSY, ESWL
Anesthesia: LOCAL | Laterality: Left

## 2021-05-09 ENCOUNTER — Other Ambulatory Visit: Payer: Self-pay | Admitting: Urology

## 2021-05-19 NOTE — Patient Instructions (Addendum)
DUE TO COVID-19 ONLY ONE VISITOR IS ALLOWED TO COME WITH YOU AND STAY IN THE WAITING ROOM ONLY DURING PRE OP AND PROCEDURE.   **NO VISITORS ARE ALLOWED IN THE SHORT STAY AREA OR RECOVERY ROOM!!**       Your procedure is scheduled on: 06/13/21   Report to [facility] Main Entrance   Report to admitting at 5:15 AM   Call this number if you have problems the morning of surgery [phone]   Do not eat food the day before surgery. Please follow a clear liquid diet instructions from surgeons office.   May have liquids until 4:30 AM day of surgery  CLEAR LIQUID DIET  Foods Allowed                                                                     Foods Excluded  Water, Black Coffee and tea (no milk or creamer)           liquids that you cannot  Plain Jell-O in any flavor  (No red)                                    see through such as: Fruit ices (not with fruit pulp)                                            milk, soups, orange juice              Iced Popsicles (No red)                                                All solid food                                   Apple juices Sports drinks like Gatorade (No red) Lightly seasoned clear broth or consume(fat free) Sugar   Oral Hygiene is also important to reduce your risk of infection.                                    Remember - BRUSH YOUR TEETH THE MORNING OF SURGERY WITH YOUR REGULAR TOOTHPASTE   Take these medicines the morning of surgery with A SIP OF WATER: Metoprolol, Crestor.   DO NOT TAKE ANY ORAL DIABETIC MEDICATIONS DAY OF YOUR SURGERY  How to Manage Your Diabetes Before and After Surgery  Why is it important to control my blood sugar before and after surgery? Improving blood sugar levels before and after surgery helps healing and can limit problems. A way of improving blood sugar control is eating a healthy diet by:  Eating less sugar and carbohydrates  Increasing activity/exercise  Talking with your  doctor about reaching your blood sugar goals High blood sugars (greater than 180 mg/dL)  can raise your risk of infections and slow your recovery, so you will need to focus on controlling your diabetes during the weeks before surgery. Make sure that the doctor who takes care of your diabetes knows about your planned surgery including the date and location.  How do I manage my blood sugar before surgery? Check your blood sugar at least 4 times a day, starting 2 days before surgery, to make sure that the level is not too high or low. Check your blood sugar the morning of your surgery when you wake up and every 2 hours until you get to the Short Stay unit. If your blood sugar is less than 70 mg/dL, you will need to treat for low blood sugar: Do not take insulin. Treat a low blood sugar (less than 70 mg/dL) with  cup of clear juice (cranberry or apple), 4 glucose tablets, OR glucose gel. Recheck blood sugar in 15 minutes after treatment (to make sure it is greater than 70 mg/dL). If your blood sugar is not greater than 70 mg/dL on recheck, call [phone] for further instructions. Report your blood sugar to the short stay nurse when you get to Short Stay.  If you are admitted to the hospital after surgery: Your blood sugar will be checked by the staff and you will probably be given insulin after surgery (instead of oral diabetes medicines) to make sure you have good blood sugar levels. The goal for blood sugar control after surgery is 80-180 mg/dL.   WHAT DO I DO ABOUT MY DIABETES MEDICATION?  Do not take oral diabetes medicines (pills) the morning of surgery.  THE DAY BEFORE SURGERY, take Metformin as prescribed       THE MORNING OF SURGERY, do not take Metformin.  Reviewed and Endorsed by [facility] Patient Education Committee, August 2015                               You may not have any metal on your body including jewelry, and body piercing             Do not wear lotions,  powders, cologne, or deodorant              Men may shave face and neck.   Do not bring valuables to the hospital. [place].   Please call cardiologist and ask for instructions regarding Brilanta and Aspirin before surgery.    Patients discharged on the day of surgery will not be allowed to drive home.   Please read over the following fact sheets you were given: IF YOU HAVE QUESTIONS ABOUT YOUR PRE-OP INSTRUCTIONS PLEASE CALL [facility] - Preparing for Surgery Before surgery, you can play an important role.  Because skin is not sterile, your skin needs to be as free of germs as possible.  You can reduce the number of germs on your skin by washing with CHG (chlorahexidine gluconate) soap before surgery.  CHG is an antiseptic cleaner which kills germs and bonds with the skin to continue killing germs even after washing. Please DO NOT use if you have an allergy to CHG or antibacterial soaps.  If your skin becomes reddened/irritated stop using the CHG and inform your nurse when you arrive at Short Stay. Do not shave (including legs and underarms) for at  least 48 hours prior to the first CHG shower.  You may shave your face/neck.  Please follow these instructions carefully:  1.  Shower with CHG Soap the night before surgery and the  morning of surgery.  2.  If you choose to wash your hair, wash your hair first as usual with your normal  shampoo.  3.  After you shampoo, rinse your hair and body thoroughly to remove the shampoo.                             4.  Use CHG as you would any other liquid soap.  You can apply chg directly to the skin and wash.  Gently with a scrungie or clean washcloth.  5.  Apply the CHG Soap to your body ONLY FROM THE NECK DOWN.   Do   not use on face/ open                           Wound or open sores. Avoid contact with eyes, ears mouth and   genitals (private parts).                       Wash  face,  Genitals (private parts) with your normal soap.             6.  Wash thoroughly, paying special attention to the area where your    surgery  will be performed.  7.  Thoroughly rinse your body with warm water from the neck down.  8.  DO NOT shower/wash with your normal soap after using and rinsing off the CHG Soap.                9.  Pat yourself dry with a clean towel.            10.  Wear clean pajamas.            11.  Place clean sheets on your bed the night of your first shower and do not  sleep with pets. Day of Surgery : Do not apply any lotions/deodorants the morning of surgery.  Please wear clean clothes to the hospital/surgery center.  FAILURE TO FOLLOW THESE INSTRUCTIONS MAY RESULT IN THE CANCELLATION OF YOUR SURGERY  PATIENT SIGNATURE_________________________________  NURSE SIGNATURE__________________________________  ________________________________________________________________________

## 2021-05-19 NOTE — Progress Notes (Addendum)
COVID swab appointment: n/a  COVID Vaccine Completed: yes x2 Date COVID Vaccine completed: Has received booster: COVID vaccine manufacturer:   Moderna   Date of COVID positive in last 90 days: no  PCP - Benito Mccreedy, MD Cardiologist - Vernell Leep, MD  Chest x-ray - 12/07/20 Epic EKG - 12/22/20 Epic Stress Test - 09/29/18 Epic ECHO - 12/07/20 Epic Cardiac Cath - 12/07/20 Epic Pacemaker/ICD device last checked: n/a Spinal Cord Stimulator: n/a  Sleep Study - n/a CPAP -   Fasting Blood Sugar - 100-140 Checks Blood Sugar _2_ times a day  Blood Thinner Instructions: Brilanta, no instructions. Will call Dr. Alveda Reasons office to ask for instructions Aspirin Instructions: ASA 81 Last Dose:  Activity level: Can go up a flight of stairs and perform activities of daily living without stopping and without symptoms of chest pain or shortness of breath.     Anesthesia review:  HTM, CAD, NSTEMI, DM. PAT BP 145/95. Patient did not take medication yet this morning and it has been 120s/60s at home. Instructed pt to take BP med and check later today and to continue checking at home. If diastolic greater than 90 call PCP.  Patient denies shortness of breath, fever, cough and chest pain at PAT appointment   Patient verbalized understanding of instructions that were given to them at the PAT appointment. Patient was also instructed that they will need to review over the PAT instructions again at home before surgery.

## 2021-05-23 ENCOUNTER — Encounter (HOSPITAL_COMMUNITY)
Admission: RE | Admit: 2021-05-23 | Discharge: 2021-05-23 | Disposition: A | Discharge status: Home / Self Care | Payer: Medicaid Other | Source: Ambulatory Visit | Attending: Urology | Admitting: Urology

## 2021-05-23 ENCOUNTER — Other Ambulatory Visit: Payer: Self-pay

## 2021-05-23 ENCOUNTER — Encounter (HOSPITAL_COMMUNITY): Payer: Self-pay

## 2021-05-23 VITALS — BP 142/95 | HR 62 | Temp 98.6°F | Resp 16 | Ht 64.96 in | Wt 202.2 lb

## 2021-05-23 DIAGNOSIS — Z01812 Encounter for preprocedural laboratory examination: Secondary | ICD-10-CM | POA: Insufficient documentation

## 2021-05-23 DIAGNOSIS — I10 Essential (primary) hypertension: Secondary | ICD-10-CM | POA: Diagnosis not present

## 2021-05-23 DIAGNOSIS — Z87442 Personal history of urinary calculi: Secondary | ICD-10-CM | POA: Insufficient documentation

## 2021-05-23 DIAGNOSIS — Z79899 Other long term (current) drug therapy: Secondary | ICD-10-CM | POA: Diagnosis not present

## 2021-05-23 DIAGNOSIS — Z87891 Personal history of nicotine dependence: Secondary | ICD-10-CM | POA: Insufficient documentation

## 2021-05-23 DIAGNOSIS — Z955 Presence of coronary angioplasty implant and graft: Secondary | ICD-10-CM | POA: Diagnosis not present

## 2021-05-23 DIAGNOSIS — E119 Type 2 diabetes mellitus without complications: Secondary | ICD-10-CM | POA: Insufficient documentation

## 2021-05-23 DIAGNOSIS — K219 Gastro-esophageal reflux disease without esophagitis: Secondary | ICD-10-CM | POA: Diagnosis not present

## 2021-05-23 DIAGNOSIS — N201 Calculus of ureter: Secondary | ICD-10-CM | POA: Insufficient documentation

## 2021-05-23 DIAGNOSIS — I2510 Atherosclerotic heart disease of native coronary artery without angina pectoris: Secondary | ICD-10-CM | POA: Insufficient documentation

## 2021-05-23 DIAGNOSIS — Z7984 Long term (current) use of oral hypoglycemic drugs: Secondary | ICD-10-CM | POA: Insufficient documentation

## 2021-05-23 DIAGNOSIS — Z7982 Long term (current) use of aspirin: Secondary | ICD-10-CM | POA: Diagnosis not present

## 2021-05-23 HISTORY — DX: Personal history of urinary calculi: Z87.442

## 2021-05-23 HISTORY — DX: Type 2 diabetes mellitus without complications: E11.9

## 2021-05-23 LAB — CBC
HCT: 45.2 % (ref 39.0–52.0)
Hemoglobin: 15.3 g/dL (ref 13.0–17.0)
MCH: 29.7 pg (ref 26.0–34.0)
MCHC: 33.8 g/dL (ref 30.0–36.0)
MCV: 87.8 fL (ref 80.0–100.0)
Platelets: 171 10*3/uL (ref 150–400)
RBC: 5.15 MIL/uL (ref 4.22–5.81)
RDW: 12.5 % (ref 11.5–15.5)
WBC: 5.7 10*3/uL (ref 4.0–10.5)
nRBC: 0 % (ref 0.0–0.2)

## 2021-05-23 LAB — GLUCOSE, CAPILLARY: Glucose-Capillary: 120 mg/dL — ABNORMAL HIGH (ref 70–99)

## 2021-05-23 LAB — BASIC METABOLIC PANEL
Anion gap: 7 (ref 5–15)
BUN: 11 mg/dL (ref 6–20)
CO2: 25 mmol/L (ref 22–32)
Calcium: 8.7 mg/dL — ABNORMAL LOW (ref 8.9–10.3)
Chloride: 105 mmol/L (ref 98–111)
Creatinine, Ser: 0.76 mg/dL (ref 0.61–1.24)
GFR, Estimated: >60 mL/min (ref >60)
Glucose, Bld: 121 mg/dL — ABNORMAL HIGH (ref 70–99)
Potassium: 4.3 mmol/L (ref 3.5–5.1)
Sodium: 137 mmol/L (ref 135–145)

## 2021-05-23 LAB — HEMOGLOBIN A1C
Hgb A1c MFr Bld: 6.3 % — ABNORMAL HIGH (ref 4.8–5.6)
Mean Plasma Glucose: 134.11 mg/dL

## 2021-05-25 NOTE — Progress Notes (Addendum)
Anesthesia Chart Review   Case: [id number] Date/Time: 06/13/21 0715   Procedure: CYSTOSCOPY/RETROGRADE/URETEROSCOPY/STONE EXTRACTION WITH BASKET/HOLMIUM LASER LITHOTRIPSY/ STENT PLACEMENT (Left)   Anesthesia type: Choice   Pre-op diagnosis: LEFT URETERAL CALCULI   Location: [facility]   Surgeons: Festus Aloe, MD       DISCUSSION:50 y.o. former smoker with h/o HTN, GERD, DM II, CAD (DES 12/08/2020), left ureteral calculi scheduled for above procedure 06/13/21 with Dr. Festus Aloe.   Pt last seen by cardiology 03/24/21. Pt s/p NSTEMI with DES 11/2020.  Pt to remain on DAPT until 11/2021.    Pt will remain on Aspirin and Brilinta per Dr. Kerney Elbe. Per cardiologist, Dr. Virgina Jock, "Can move forward. Low cardiac risk."  Anticipate pt can proceed with planned procedure barring acute status change.   VS: BP (!) 142/95   Pulse 62   Temp 37 C (Oral)   Resp 16   Ht 5' 4.96" (1.65 m)   Wt 91.7 kg   SpO2 99%   BMI 33.69 kg/m   PROVIDERS: Benito Mccreedy, MD is PCP   Vernell Leep, MD is Cardiologist  LABS: Labs reviewed: Acceptable for surgery. (all labs ordered are listed, but only abnormal results are displayed)  Labs Reviewed  HEMOGLOBIN A1C - Abnormal; Notable for the following components:      Result Value   Hgb A1c MFr Bld 6.3 (*)    All other components within normal limits  BASIC METABOLIC PANEL - Abnormal; Notable for the following components:   Glucose, Bld 121 (*)    Calcium 8.7 (*)    All other components within normal limits  GLUCOSE, CAPILLARY - Abnormal; Notable for the following components:   Glucose-Capillary 120 (*)    All other components within normal limits  CBC     IMAGES:   EKG: EKG 12/22/2020: Sinus rhythm 68 bpm Old inferior infarct  CV: Echo 12/07/2020  1. Left ventricular ejection fraction, by estimation, is 50 to 55%. The  left ventricle has low normal function. The left ventricle has no regional  wall motion  abnormalities. There is mild left ventricular hypertrophy.  Left ventricular diastolic  parameters are consistent with Grade I diastolic dysfunction (impaired  relaxation). The average left ventricular global longitudinal strain is  -15.7 %, which is mildly reduced.   2. Right ventricular systolic function is low normal. The right  ventricular size is normal.   3. No significant valvular abnormality.   4. The inferior vena cava is normal in size with greater than 50%  respiratory variability, suggesting right atrial pressure of 3 mmHg.  Past Medical History:  Diagnosis Date   Depression    Diabetes mellitus without complication (HCC)    GERD (gastroesophageal reflux disease)    History of kidney stones    Hyperlipidemia    Hypertension    Neuromuscular disorder (HCC)    neck and shoulder pain right side down to fingers and numbess, steriod shots   Tobacco abuse     Past Surgical History:  Procedure Laterality Date   CORONARY STENT INTERVENTION N/A 12/08/2020   Procedure: CORONARY STENT INTERVENTION;  Surgeon: Nigel Mormon, MD;  Location: [place] [facility];  Service: Cardiovascular;  Laterality: N/A;   LEFT HEART CATH AND CORONARY ANGIOGRAPHY N/A 12/07/2020   Procedure: LEFT HEART CATH AND CORONARY ANGIOGRAPHY;  Surgeon: Nigel Mormon, MD;  Location: [place] [facility];  Service: Cardiovascular;  Laterality: N/A;   WISDOM TOOTH EXTRACTION      MEDICATIONS:  aspirin 81 MG EC tablet   calcium carbonate (OSCAL) 1500 (600 Ca) MG TABS tablet   cephALEXin (KEFLEX) 500 MG capsule   Diclofenac Sodium CR 100 MG 24 hr tablet   losartan (COZAAR) 25 MG tablet   losartan (COZAAR) 50 MG tablet   metFORMIN (GLUCOPHAGE) 500 MG tablet   metoprolol succinate (TOPROL-XL) 25 MG 24 hr tablet   nitroGLYCERIN (NITROSTAT) 0.4 MG SL tablet   ondansetron (ZOFRAN) 4 MG tablet   rosuvastatin (CRESTOR) 20 MG tablet   tamsulosin (FLOMAX) 0.4 MG CAPS capsule   ticagrelor (BRILINTA) 90 MG  TABS tablet   No current facility-administered medications for this encounter.     Konrad Felix Ward, PA-C [facility] [phone]

## 2021-05-29 ENCOUNTER — Telehealth: Payer: Self-pay | Admitting: Cardiology

## 2021-05-29 NOTE — Telephone Encounter (Signed)
Pt daughter needs a call back regarding some medication pt is currently on and may need to stop the 2 days before a procedure.

## 2021-05-30 ENCOUNTER — Other Ambulatory Visit: Payer: Self-pay

## 2021-05-30 ENCOUNTER — Other Ambulatory Visit: Payer: Self-pay | Admitting: Urology

## 2021-05-30 DIAGNOSIS — I2510 Atherosclerotic heart disease of native coronary artery without angina pectoris: Secondary | ICD-10-CM

## 2021-05-30 DIAGNOSIS — I10 Essential (primary) hypertension: Secondary | ICD-10-CM

## 2021-05-30 MED ORDER — METOPROLOL SUCCINATE ER 25 MG PO TB24: 25.0000 mg | ORAL_TABLET | Freq: Every day | ORAL | 3 refills | Status: AC

## 2021-05-30 NOTE — Telephone Encounter (Signed)
Called pt, daughter Clementeen Graham answer and was informed about the message above and pt metoprolol was send. Pt daughter understood.

## 2021-05-30 NOTE — Telephone Encounter (Signed)
I spoke with daughter Clementeen Graham, the medications are Brilinta and Aspirin she is asking about possibly needing to stop 2 days before the procedure.  The procedure is a cystoscopy being done by Dr. Festus Aloe.  Also, she stated that patient has finished the bottle of Metoprolol you prescribed and wants to know if he needs to stop it, or do we need to refill it for him? Please advise.

## 2021-05-30 NOTE — Telephone Encounter (Signed)
Yes. Can move forward. Low cardiac risk.  Thanks MJP

## 2021-05-30 NOTE — Telephone Encounter (Signed)
Randall Washington from [facility], called and stated that the information given to patients daughter was incorrect. The patient will not need to stop Aspirin or Brilinta for this procedure. Patient has left ureteral stone, so she is asking, will you let him move forward with the cystoscopy with ureteroscopy with laser lithotripsy and stent placement.  since we know now that we do not need to stop the Aspirin or Brilinta? Please advise.

## 2021-05-30 NOTE — Telephone Encounter (Signed)
Patient had MI and stents in 11/2020. If procedure is not urgent, ideally, would delay the procedure by 6 months. If procedure is urgent, recommend minimizing Brilinta interruption to no more than 3 days. He does need the metoprolol. Please send refill for 90 days of the same dose and frequency as before.  Thanks MJP

## 2021-05-31 ENCOUNTER — Telehealth: Payer: Self-pay

## 2021-06-01 NOTE — Telephone Encounter (Signed)
Spoke to patient's wife she is aware

## 2021-06-01 NOTE — Telephone Encounter (Signed)
This recommendation was only if the Brilinta needed to be stopped for the procedure. Urology told us that they did not need to stop btilinta for the procedure. Therefore, no change needs to be made to ongoing daily Aspirin and Brilinta use. Please continue uninterrupted.  Thanks MJP

## 2021-06-05 NOTE — Anesthesia Preprocedure Evaluation (Addendum)
Anesthesia Evaluation  Patient identified by MRN, date of birth, ID band Patient awake    Reviewed: Allergy & Precautions, NPO status , Patient's Chart, lab work & pertinent test results, reviewed documented beta blocker date and time   Airway Mallampati: III  TM Distance: >3 FB Neck ROM: Full    Dental no notable dental hx.    Pulmonary former smoker,    Pulmonary exam normal        Cardiovascular hypertension, Pt. on medications and Pt. on home beta blockers + CAD and + Past MI  Normal cardiovascular exam     Neuro/Psych Depression    GI/Hepatic   Endo/Other  diabetes, Type 2, Oral Hypoglycemic Agents  Renal/GU Renal disease  negative genitourinary   Musculoskeletal negative musculoskeletal ROS (+)   Abdominal (+) + obese,   Peds  Hematology   Anesthesia Other Findings Procedures  CORONARY STENT INTERVENTION  Conclusion  LM: Normal LAD: Minimal luminal irregularities Diag 1 ostial 50% stenosis Ramus: Ostial 40% stenosis LCx: Prox-mid tandem 90% stenoses, followed by a 40% stenosis RCA: Angiogram performed on 12/07/2020        Mid vessel occlusion Left-to-right collaterals fiill distal RCA         Successful percutaneous coronary intervention Prox-Mid LCx        PTCA and overlapping stents placement         2.75 X 26 mm Resolute drug-eluting stent        2.5 X 15 mm Resolute drug-eluting stent  0% residual stenosis TIMI flow II-->III    Reproductive/Obstetrics                           Anesthesia Physical Anesthesia Plan  ASA: 3  Anesthesia Plan: General   Post-op Pain Management:    Induction: Intravenous  PONV Risk Score and Plan: 4 or greater and Ondansetron and Midazolam  Airway Management Planned: LMA  Additional Equipment: None  Intra-op Plan:   Post-operative Plan:   Informed Consent: I have reviewed the patients History and Physical,  chart, labs and discussed the procedure including the risks, benefits and alternatives for the proposed anesthesia with the patient or authorized representative who has indicated his/her understanding and acceptance.     Dental advisory given  Plan Discussed with: CRNA  Anesthesia Plan Comments: (See PAT note 05/23/21, Konrad Felix Ward, PA-C)       Anesthesia Quick Evaluation

## 2021-06-13 ENCOUNTER — Ambulatory Visit (HOSPITAL_COMMUNITY)
Admission: RE | Admit: 2021-06-13 | Discharge: 2021-06-13 | Disposition: A | Discharge status: Home / Self Care | Payer: Medicaid Other | Attending: Urology | Admitting: Urology

## 2021-06-13 ENCOUNTER — Encounter (HOSPITAL_COMMUNITY): Payer: Self-pay | Admitting: Urology

## 2021-06-13 ENCOUNTER — Ambulatory Visit (HOSPITAL_COMMUNITY): Payer: Medicaid Other

## 2021-06-13 ENCOUNTER — Ambulatory Visit (HOSPITAL_COMMUNITY): Payer: Medicaid Other | Admitting: Anesthesiology

## 2021-06-13 ENCOUNTER — Encounter (HOSPITAL_COMMUNITY)
Admission: RE | Disposition: A | Discharge status: Home / Self Care | Payer: Self-pay | Source: Home / Self Care | Attending: Urology

## 2021-06-13 ENCOUNTER — Ambulatory Visit (HOSPITAL_COMMUNITY): Payer: Medicaid Other | Admitting: Physician Assistant

## 2021-06-13 DIAGNOSIS — Z7984 Long term (current) use of oral hypoglycemic drugs: Secondary | ICD-10-CM | POA: Insufficient documentation

## 2021-06-13 DIAGNOSIS — Z87891 Personal history of nicotine dependence: Secondary | ICD-10-CM | POA: Diagnosis not present

## 2021-06-13 DIAGNOSIS — Z79899 Other long term (current) drug therapy: Secondary | ICD-10-CM | POA: Diagnosis not present

## 2021-06-13 DIAGNOSIS — N201 Calculus of ureter: Secondary | ICD-10-CM | POA: Insufficient documentation

## 2021-06-13 DIAGNOSIS — E119 Type 2 diabetes mellitus without complications: Secondary | ICD-10-CM | POA: Insufficient documentation

## 2021-06-13 DIAGNOSIS — I10 Essential (primary) hypertension: Secondary | ICD-10-CM | POA: Diagnosis not present

## 2021-06-13 DIAGNOSIS — I2510 Atherosclerotic heart disease of native coronary artery without angina pectoris: Secondary | ICD-10-CM | POA: Diagnosis not present

## 2021-06-13 DIAGNOSIS — Z955 Presence of coronary angioplasty implant and graft: Secondary | ICD-10-CM | POA: Diagnosis not present

## 2021-06-13 DIAGNOSIS — I252 Old myocardial infarction: Secondary | ICD-10-CM | POA: Diagnosis not present

## 2021-06-13 HISTORY — PX: CYSTOSCOPY/RETROGRADE/URETEROSCOPY/STONE EXTRACTION WITH BASKET: SHX5317

## 2021-06-13 LAB — GLUCOSE, CAPILLARY
Glucose-Capillary: 130 mg/dL — ABNORMAL HIGH (ref 70–99)
Glucose-Capillary: 116 mg/dL — ABNORMAL HIGH (ref 70–99)

## 2021-06-13 SURGERY — CYSTOSCOPY, WITH CALCULUS REMOVAL USING BASKET
Anesthesia: General | Site: Ureter | Laterality: Left

## 2021-06-13 MED ORDER — LACTATED RINGERS IV SOLN: INTRAVENOUS | Status: DC

## 2021-06-13 MED ORDER — PROPOFOL 10 MG/ML IV BOLUS
INTRAVENOUS | Status: AC
  Filled 2021-06-13: qty 20

## 2021-06-13 MED ORDER — MIDAZOLAM HCL 5 MG/5ML IJ SOLN
INTRAMUSCULAR | Status: DC | PRN
  Administered 2021-06-13: 2 mg via INTRAVENOUS

## 2021-06-13 MED ORDER — FENTANYL CITRATE PF 50 MCG/ML IJ SOSY
PREFILLED_SYRINGE | INTRAMUSCULAR | Status: AC
  Filled 2021-06-13: qty 1

## 2021-06-13 MED ORDER — ONDANSETRON HCL 4 MG/2ML IJ SOLN
INTRAMUSCULAR | Status: DC | PRN
  Administered 2021-06-13: 4 mg via INTRAVENOUS

## 2021-06-13 MED ORDER — FENTANYL CITRATE (PF) 100 MCG/2ML IJ SOLN
INTRAMUSCULAR | Status: AC
  Filled 2021-06-13: qty 2

## 2021-06-13 MED ORDER — MIDAZOLAM HCL 2 MG/2ML IJ SOLN
INTRAMUSCULAR | Status: AC
  Filled 2021-06-13: qty 2

## 2021-06-13 MED ORDER — 0.9 % SODIUM CHLORIDE (POUR BTL) OPTIME
TOPICAL | Status: DC | PRN
  Administered 2021-06-13: 1000 mL

## 2021-06-13 MED ORDER — SODIUM CHLORIDE 0.9 % IR SOLN
Status: DC | PRN
  Administered 2021-06-13: 3000 mL via INTRAVESICAL

## 2021-06-13 MED ORDER — ORAL CARE MOUTH RINSE: 15.0000 mL | Freq: Once | OROMUCOSAL | Status: AC

## 2021-06-13 MED ORDER — ACETAMINOPHEN 325 MG PO TABS: 325.0000 mg | ORAL_TABLET | ORAL | Status: DC | PRN

## 2021-06-13 MED ORDER — LIDOCAINE 2% (20 MG/ML) 5 ML SYRINGE
INTRAMUSCULAR | Status: DC | PRN
  Administered 2021-06-13: 100 mg via INTRAVENOUS

## 2021-06-13 MED ORDER — METOPROLOL SUCCINATE ER 25 MG PO TB24
25.0000 mg | ORAL_TABLET | Freq: Every day | ORAL | Status: DC
  Filled 2021-06-13: qty 1

## 2021-06-13 MED ORDER — ONDANSETRON HCL 4 MG/2ML IJ SOLN: 4.0000 mg | Freq: Once | INTRAMUSCULAR | Status: DC | PRN

## 2021-06-13 MED ORDER — CEFAZOLIN SODIUM-DEXTROSE 2-4 GM/100ML-% IV SOLN
2.0000 g | Freq: Once | INTRAVENOUS | Status: AC
  Administered 2021-06-13: 2 g via INTRAVENOUS
  Filled 2021-06-13: qty 100

## 2021-06-13 MED ORDER — OXYCODONE HCL 5 MG/5ML PO SOLN: 5.0000 mg | Freq: Once | ORAL | Status: DC | PRN

## 2021-06-13 MED ORDER — ACETAMINOPHEN 10 MG/ML IV SOLN: 1000.0000 mg | Freq: Once | INTRAVENOUS | Status: DC | PRN

## 2021-06-13 MED ORDER — CHLORHEXIDINE GLUCONATE 0.12 % MT SOLN
15.0000 mL | Freq: Once | OROMUCOSAL | Status: AC
  Administered 2021-06-13: 15 mL via OROMUCOSAL

## 2021-06-13 MED ORDER — FENTANYL CITRATE PF 50 MCG/ML IJ SOSY
25.0000 ug | PREFILLED_SYRINGE | INTRAMUSCULAR | Status: DC | PRN
  Administered 2021-06-13 (×2): 50 ug via INTRAVENOUS

## 2021-06-13 MED ORDER — FENTANYL CITRATE (PF) 100 MCG/2ML IJ SOLN
INTRAMUSCULAR | Status: DC | PRN
  Administered 2021-06-13: 100 ug via INTRAVENOUS

## 2021-06-13 MED ORDER — OXYCODONE HCL 5 MG PO TABS: 5.0000 mg | ORAL_TABLET | Freq: Once | ORAL | Status: DC | PRN

## 2021-06-13 MED ORDER — DEXAMETHASONE SODIUM PHOSPHATE 10 MG/ML IJ SOLN
INTRAMUSCULAR | Status: DC | PRN
  Administered 2021-06-13: 5 mg via INTRAVENOUS

## 2021-06-13 MED ORDER — MEPERIDINE HCL 50 MG/ML IJ SOLN: 6.2500 mg | INTRAMUSCULAR | Status: DC | PRN

## 2021-06-13 MED ORDER — PROPOFOL 10 MG/ML IV BOLUS
INTRAVENOUS | Status: DC | PRN
  Administered 2021-06-13: 200 mg via INTRAVENOUS

## 2021-06-13 MED ORDER — PHENYLEPHRINE 40 MCG/ML (10ML) SYRINGE FOR IV PUSH (FOR BLOOD PRESSURE SUPPORT)
PREFILLED_SYRINGE | INTRAVENOUS | Status: DC | PRN
  Administered 2021-06-13: 80 ug via INTRAVENOUS

## 2021-06-13 MED ORDER — ACETAMINOPHEN 160 MG/5ML PO SOLN: 325.0000 mg | ORAL | Status: DC | PRN

## 2021-06-13 MED ORDER — IOHEXOL 300 MG/ML  SOLN
INTRAMUSCULAR | Status: DC | PRN
  Administered 2021-06-13: 10 mL

## 2021-06-13 SURGICAL SUPPLY — 20 items
BAG URO CATCHER STRL LF (MISCELLANEOUS) ×2 IMPLANT
BASKET ZERO TIP NITINOL 2.4FR (BASKET) ×2 IMPLANT
CATH URET 5FR 28IN CONE TIP (BALLOONS)
CATH URET 5FR 70CM CONE TIP (BALLOONS) IMPLANT
CATH URETL OPEN END 6FR 70 (CATHETERS) ×2 IMPLANT
CLOTH BEACON ORANGE TIMEOUT ST (SAFETY) ×2 IMPLANT
GLOVE SURG ENC MOIS LTX SZ7.5 (GLOVE) ×2 IMPLANT
GOWN STRL REUS W/TWL XL LVL3 (GOWN DISPOSABLE) ×2 IMPLANT
GUIDEWIRE STR DUAL SENSOR (WIRE) ×2 IMPLANT
KIT TURNOVER KIT A (KITS) IMPLANT
LASER FIB FLEXIVA PULSE ID 365 (Laser) ×2 IMPLANT
MANIFOLD NEPTUNE II (INSTRUMENTS) ×2 IMPLANT
PACK CYSTO (CUSTOM PROCEDURE TRAY) ×2 IMPLANT
SHEATH NAVIGATOR HD 11/13X28 (SHEATH) IMPLANT
SHEATH NAVIGATOR HD 11/13X36 (SHEATH) IMPLANT
STENT URET 6FRX26 CONTOUR (STENTS) ×2 IMPLANT
TRACTIP FLEXIVA PULS ID 200XHI (Laser) IMPLANT
TRACTIP FLEXIVA PULSE ID 200 (Laser)
TUBING CONNECTING 10 (TUBING) ×2 IMPLANT
TUBING UROLOGY SET (TUBING) ×2 IMPLANT

## 2021-06-13 NOTE — Anesthesia Postprocedure Evaluation (Signed)
Anesthesia Post Note  Patient: Randall Washington  Procedure(s) Performed: CYSTOSCOPY/RETROGRADE/URETEROSCOPY/STONE EXTRACTION WITH BASKET/HOLMIUM LASER LITHOTRIPSY/ STENT PLACEMENT (Left: Ureter)     Patient location during evaluation: PACU Anesthesia Type: General Level of consciousness: awake and sedated Pain management: pain level controlled Vital Signs Assessment: post-procedure vital signs reviewed and stable Respiratory status: spontaneous breathing Cardiovascular status: stable Postop Assessment: no apparent nausea or vomiting Anesthetic complications: no   No notable events documented.  Last Vitals:  Vitals:   06/13/21 0930 06/13/21 0940  BP: (!) 144/85 (!) 128/96  Pulse: 67 70  Resp: 10 12  Temp: 36.5 C 36.6 C  SpO2: 95% 97%    Last Pain:  Vitals:   06/13/21 0940  TempSrc:   PainSc: 0-No pain                 Huston Foley

## 2021-06-13 NOTE — H&P (Signed)
H&P  Chief Complaint: Left ureteral stone  History of Present Illness: Randall Washington is a 50 year old male who presented with left flank pain and CT scan September 2022 revealed a 10 mm left distal stone.  He was not cleared to stop Brilinta for shockwave.  He followed up in the office October 2022 and a KUB revealed continued left ureteral stone.  Patient denies stone passage.  No fever or dysuria.  His UA was negative in the office.  He presents today for cystoscopy with left ureteroscopy laser lithotripsy and stent placement.  Past Medical History:  Diagnosis Date   Depression    Diabetes mellitus without complication (HCC)    GERD (gastroesophageal reflux disease)    History of kidney stones    Hyperlipidemia    Hypertension    Neuromuscular disorder (HCC)    neck and shoulder pain right side down to fingers and numbess, steriod shots   Tobacco abuse    Past Surgical History:  Procedure Laterality Date   CORONARY STENT INTERVENTION N/A 12/08/2020   Procedure: CORONARY STENT INTERVENTION;  Surgeon: Nigel Mormon, MD;  Location: [facility];  Service: Cardiovascular;  Laterality: N/A;   LEFT HEART CATH AND CORONARY ANGIOGRAPHY N/A 12/07/2020   Procedure: LEFT HEART CATH AND CORONARY ANGIOGRAPHY;  Surgeon: Nigel Mormon, MD;  Location: [facility];  Service: Cardiovascular;  Laterality: N/A;   WISDOM TOOTH EXTRACTION      Home Medications:  Medications Prior to Admission  Medication Sig Dispense Refill Last Dose   aspirin 81 MG EC tablet Take 1 tablet (81 mg total) by mouth daily. Swallow whole. 90 tablet 3 06/12/2021   calcium carbonate (OSCAL) 1500 (600 Ca) MG TABS tablet Take 1,200 mg by mouth in the morning.   06/12/2021   cephALEXin (KEFLEX) 500 MG capsule Take 500 mg by mouth 4 (four) times daily.      losartan (COZAAR) 25 MG tablet Take 25 mg by mouth in the morning.   06/12/2021   metFORMIN (GLUCOPHAGE) 500 MG tablet Take 1 tablet (500 mg total) by mouth  2 (two) times daily with a meal. 60 tablet 0 06/12/2021   metoprolol succinate (TOPROL-XL) 25 MG 24 hr tablet Take 1 tablet (25 mg total) by mouth daily. 90 tablet 3 06/12/2021   nitroGLYCERIN (NITROSTAT) 0.4 MG SL tablet Place 1 tablet (0.4 mg total) under the tongue every 5 (five) minutes as needed for chest pain. 30 tablet 3    rosuvastatin (CRESTOR) 20 MG tablet Take 20 mg by mouth daily.   06/12/2021   ticagrelor (BRILINTA) 90 MG TABS tablet Take 1 tablet (90 mg total) by mouth 2 (two) times daily. 180 tablet 2 06/12/2021   Diclofenac Sodium CR 100 MG 24 hr tablet Take 1 tablet (100 mg total) by mouth daily. (Patient not taking: Reported on 05/18/2021) 10 tablet 0 Not Taking   losartan (COZAAR) 50 MG tablet Take 0.5 tablets (25 mg total) by mouth daily. (Patient not taking: Reported on 05/18/2021) 90 tablet 3 Not Taking   ondansetron (ZOFRAN) 4 MG tablet Take 1 tablet (4 mg total) by mouth every 8 (eight) hours as needed for nausea or vomiting. (Patient not taking: Reported on 05/18/2021) 12 tablet 0 Not Taking   tamsulosin (FLOMAX) 0.4 MG CAPS capsule Take 1 capsule (0.4 mg total) by mouth daily. (Patient not taking: Reported on 05/18/2021) 30 capsule 0 Not Taking   Allergies: No Known Allergies  Family History  Problem Relation Age of Onset  Heart disease Father    Heart attack Father 27   Heart disease Brother    Heart disease Brother    Heart disease Brother    Heart disease Brother    Social History:  reports that he quit smoking about 6 months ago. His smoking use included cigarettes. He has a 30.00 pack-year smoking history. He has never used smokeless tobacco. He reports that he does not drink alcohol and does not use drugs.  ROS: A complete review of systems was performed.  All systems are negative except for pertinent findings as noted. Review of Systems  All other systems reviewed and are negative.   Physical Exam:  Vital signs in last 24 hours: Temp:  [97.7 F (36.5  C)] 97.7 F (36.5 C) (11/22 0556) Pulse Rate:  [73] 73 (11/22 0556) Resp:  [16] 16 (11/22 0556) BP: (149)/(83) 149/83 (11/22 0556) SpO2:  [98 %] 98 % (11/22 0556) General:  Alert and oriented, No acute distress HEENT: Normocephalic, atraumatic Cardiovascular: Regular rate and rhythm Lungs: Regular rate and effort Abdomen: Soft, nontender, nondistended, no abdominal masses Extremities: No edema Neurologic: Grossly intact  Laboratory Data:  Results for orders placed or performed during the hospital encounter of 06/13/21 (from the past 24 hour(s))  Glucose, capillary     Status: Abnormal   Collection Time: 06/13/21  6:09 AM  Result Value Ref Range   Glucose-Capillary 130 (H) 70 - 99 mg/dL   No results found for this or any previous visit (from the past 240 hour(s)). Creatinine: No results for input(s): CREATININE in the last 168 hours.  Impression/Assessment:  Left ureteral stone -   Plan:  I discussed with the patient the nature, potential benefits, risks and alternatives to cystoscopy, left ureteroscopy, laser lithotripsy, stent placement,  including side effects of the proposed treatment, the likelihood of the patient achieving the goals of the procedure, and any potential problems that might occur during the procedure or recuperation. All questions answered. Patient elects to proceed.    Festus Aloe 06/13/2021, 7:17 AM

## 2021-06-13 NOTE — Op Note (Signed)
Preoperative diagnosis: Left ureteral stone Postoperative diagnosis: Same  Procedure: Cystoscopy with left retrograde pyelogram, left ureteroscopy laser lithotripsy stone basket extraction, left ureteral stent placement  Surgeon: Junious Silk  Anesthesia: General  Indication for procedure: Randall Washington is a 50 year old male with history of left distal stone.  Stone was about 10 mm and did not pass.  He could not come off anticoagulation.  He was brought for ureteroscopy.  Findings: On exam the penis was normal without lesion.  Glans and meatus normal.  Foreskin normal.  On cystoscopy the urethra prostate and bladder were unremarkable.  No stone or foreign body in the bladder.  Left retrograde pyelogram-this outlined a large filling defect in the left distal ureter consistent with the stone with some mild dilation of the ureter proximally and minimal dilation of the collecting system.  On ureteroscopy the stone was located, dusted and the largest fragments collected and dropped in the bladder.  Description of procedure: After consent was obtained patient brought to the operating room.  After adequate anesthesia he was placed lithotomy position and prepped and draped in the usual sterile fashion.  Timeout was performed to confirm the patient and procedure.  Cystoscope was passed per urethra and the bladder inspected.  Left ureteral orifice cannulated with a 5 [place] open-ended catheter and left retrograde injection of contrast was performed.  Sensor wire was advanced and coiled in the upper calyx.  Dual channel semirigid was advanced and the stone was located in the left distal ureter.  It was right up against the UVJ with plenty of working room in the distal ureter.  49 m laser fiber was passed and the stone was dusted and fragmented.  A 0 tip basket was passed and the largest fragments were dropped in the bladder sequentially.  Final cystoscopic evaluation all the way up through the mid ureter noted  there to be no other stone fragments or ureteral injury.  The cystoscope was passed per urethra to drain the bladder and drained the stone fragments.  These were collected for office analysis.  The wire was backloaded on the cystoscope and a 6 x 26 cm stent advanced.  The wire was removed with a coil seen up in the upper calyx and a coil in the bladder.  Bladder was drained and the scope removed.  The string was taped to the patient.  He was awakened and taken the cover room in stable condition.  Complications: None  Blood loss: Minimal  Specimens: Stone fragments to office lab  Drains: 6 x 26 cm left ureteral stent with string  Disposition: Patient stable to PACU

## 2021-06-13 NOTE — Transfer of Care (Signed)
Immediate Anesthesia Transfer of Care Note  Patient: Randall Washington  Procedure(s) Performed: CYSTOSCOPY/RETROGRADE/URETEROSCOPY/STONE EXTRACTION WITH BASKET/HOLMIUM LASER LITHOTRIPSY/ STENT PLACEMENT (Left: Ureter)  Patient Location: PACU  Anesthesia Type:General  Level of Consciousness: sedated, patient cooperative and responds to stimulation  Airway & Oxygen Therapy: Patient Spontanous Breathing and Patient connected to face mask oxygen  Post-op Assessment: Report given to RN and Post -op Vital signs reviewed and stable  Post vital signs: Reviewed and stable  Last Vitals:  Vitals Value Taken Time  BP 146/83 06/13/21 0833  Temp    Pulse 84 06/13/21 0836  Resp 14 06/13/21 0836  SpO2 100 % 06/13/21 0836  Vitals shown include unvalidated device data.  Last Pain:  Vitals:   06/13/21 0556  TempSrc: Oral  PainSc: 0-No pain         Complications: No notable events documented.

## 2021-06-13 NOTE — Anesthesia Procedure Notes (Signed)
Procedure Name: LMA Insertion Date/Time: 06/13/2021 7:47 AM Performed by: Gean Maidens, CRNA Pre-anesthesia Checklist: Patient identified, Emergency Drugs available, Suction available, Patient being monitored and Timeout performed Patient Re-evaluated:Patient Re-evaluated prior to induction Oxygen Delivery Method: Circle system utilized Preoxygenation: Pre-oxygenation with 100% oxygen Induction Type: IV induction Ventilation: Mask ventilation with difficulty LMA: LMA flexible inserted LMA Size: 4.0 Number of attempts: 1 Placement Confirmation: positive ETCO2 and breath sounds checked- equal and bilateral Tube secured with: Tape Dental Injury: Teeth and Oropharynx as per pre-operative assessment

## 2021-06-13 NOTE — Discharge Instructions (Signed)
Remove the stent on Monday morning 06/19/2021 with string as instructed

## 2021-06-14 ENCOUNTER — Encounter (HOSPITAL_COMMUNITY): Payer: Self-pay | Admitting: Urology

## 2021-09-22 ENCOUNTER — Other Ambulatory Visit: Payer: Self-pay

## 2021-09-22 ENCOUNTER — Ambulatory Visit: Payer: Self-pay | Admitting: Cardiology

## 2021-09-22 NOTE — Progress Notes (Signed)
Error

## 2021-09-27 ENCOUNTER — Encounter: Payer: Self-pay | Admitting: Cardiology

## 2021-09-27 ENCOUNTER — Ambulatory Visit: Payer: Medicaid Other | Admitting: Cardiology

## 2021-09-27 ENCOUNTER — Other Ambulatory Visit: Payer: Self-pay

## 2021-09-27 VITALS — BP 149/87 | HR 76 | Temp 98.1°F | Resp 17 | Ht 64.96 in | Wt 206.0 lb

## 2021-09-27 DIAGNOSIS — I2510 Atherosclerotic heart disease of native coronary artery without angina pectoris: Secondary | ICD-10-CM

## 2021-09-27 DIAGNOSIS — I10 Essential (primary) hypertension: Secondary | ICD-10-CM

## 2021-09-27 MED ORDER — LOSARTAN POTASSIUM 50 MG PO TABS: 50.0000 mg | ORAL_TABLET | Freq: Every morning | ORAL | 3 refills | Status: DC

## 2021-09-27 NOTE — Progress Notes (Signed)
? ?Follow up visit ? ?Subjective:  ? ?Torell Litsey, male    DOB: 16-Jan-1971, 51 y.o.   MRN: [id number] ? ? ?HPI ? ?Chief Complaint  ?Patient presents with  ? Follow-up  ?  6 MONTH  ? Coronary Artery Disease  ? ? ?51 y/o [place] American man with hypertension, hyperlipidemia, nicotine dependence, CAD with NSTEMI 11/2020, h/o COVID positive (11/2020) ? ?Patient is here with his daughter, who assisted with language interpretation. He is doing well, denies chest pain, shortness of breath, palpitations, leg edema, orthopnea, PND, TIA/syncope. 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 Blood pressure elevated today.  ? ? ?Current Outpatient Medications:  ?  aspirin 81 MG EC tablet, Take 1 tablet (81 mg total) by mouth daily. Swallow whole., Disp: 90 tablet, Rfl: 3 ?  losartan (COZAAR) 25 MG tablet, Take 25 mg by mouth in the morning., Disp: , Rfl:  ?  metFORMIN (GLUCOPHAGE) 500 MG tablet, Take 1 tablet (500 mg total) by mouth 2 (two) times daily with a meal., Disp: 60 tablet, Rfl: 0 ?  metoprolol succinate (TOPROL-XL) 25 MG 24 hr tablet, Take 1 tablet (25 mg total) by mouth daily., Disp: 90 tablet, Rfl: 3 ?  nitroGLYCERIN (NITROSTAT) 0.4 MG SL tablet, Place 1 tablet (0.4 mg total) under the tongue every 5 (five) minutes as needed for chest pain., Disp: 30 tablet, Rfl: 3 ?  rosuvastatin (CRESTOR) 20 MG tablet, Take 20 mg by mouth daily., Disp: , Rfl:  ?  ticagrelor (BRILINTA) 90 MG TABS tablet, Take 1 tablet (90 mg total) by mouth 2 (two) times daily., Disp: 180 tablet, Rfl: 2 ?  calcium carbonate (OSCAL) 1500 (600 Ca) MG TABS tablet, Take 1,200 mg by mouth in the morning., Disp: , Rfl:  ?  ondansetron (ZOFRAN) 4 MG tablet, Take 1 tablet (4 mg total) by mouth every 8 (eight) hours as needed for nausea or vomiting., Disp: 12 tablet, Rfl: 0 ?  tamsulosin (FLOMAX) 0.4 MG CAPS capsule, Take 1 capsule (0.4 mg total) by mouth daily., Disp: 30 capsule, Rfl: 0 ? ?Cardiovascular & other  pertient studies: ? ?EKG 09/27/2021: ?Sinus rhythm 67 bpm  ?Old inferior-apical infarct ? ?Coronary intervention 12/08/2020: ?LM: Normal ?LAD: Minimal luminal irregularities ?       Diag 1 ostial 50% stenosis ?Ramus: Ostial 40% stenosis ?LCx: Prox-mid tandem 90% stenoses, followed by a 40% stenosis ?RCA: Angiogram performed on 12/07/2020 ?       Mid vessel occlusion ?       Left-to-right collaterals fiill distal RCA ?  ?       Successful percutaneous coronary intervention Prox-Mid LCx ?       PTCA and overlapping stents placement  ?       2.75 X 26 mm Resolute drug-eluting stent ?       2.5 X 15 mm Resolute drug-eluting stent ?  ?0% residual stenosis ?TIMI flow II-->III ?  ?  ?Coronary angiography 12/07/2020: ?LM: Normal ?LAD: Minimal luminal irregularities ?       Diag 1 ostial 50% stenosis ?Ramus: Ostial 40% stenosis ?LCx: Prox-mid tandem 90% stenoses ?RCA: Mid vessel occlusion ?          Left-to-right collaterals frill distal RCA ?          Attempted wiring, aborted due to chronic and non-culprit nature of the lesion ?  ?Plan for staged intervention to LCx tomorrow. ?Resume IV heparin after TR band removal.  ?  ?  Echocardiogram 12/07/2020: ? 1. Left ventricular ejection fraction, by estimation, is 50 to 55%. The  ?left ventricle has low normal function. The left ventricle has no regional  ?wall motion abnormalities. There is mild left ventricular hypertrophy.  ?Left ventricular diastolic  ?parameters are consistent with Grade I diastolic dysfunction (impaired  ?relaxation). The average left ventricular global longitudinal strain is  ?-15.7 %, which is mildly reduced.  ? 2. Right ventricular systolic function is low normal. The right  ?ventricular size is normal.  ? 3. No significant valvular abnormality.  ? 4. The inferior vena cava is normal in size with greater than 50%  ?respiratory variability, suggesting right atrial pressure of 3 mmHg.  ?  ?  ?Recent labs:  ?Sep-Nov 2022: ?Glucose 121, BUN/Cr 11/0.76. EGFR >60.  Na/K 137/4.3. Rest of the CMP normal ?H/H 15/45. MCV 87. Platelets 171 ?HbA1C 6.3% ?Chol 131, TG 159, HDL 46, LDL 58 ? ? ? ? ?Review of Systems  ?Constitutional: Positive for malaise/fatigue.  ?Cardiovascular:  Positive for chest pain (AS per HPI). Negative for dyspnea on exertion, leg swelling, palpitations and syncope.  ? ?   ? ? ?Vitals:  ? 09/27/21 1134 09/27/21 1145  ?BP: (!) 144/76 (!) 149/87  ?Pulse: 76 76  ?Resp: 17   ?Temp: 98.1 ?F (36.7 ?C)   ?SpO2: 99% 98%  ? ? ?Body mass index is 34.32 kg/m?. ?Filed Weights  ? 09/27/21 1134  ?Weight: 206 lb (93.4 kg)  ? ? ? ?Objective:  ? Physical Exam ?Vitals and nursing note reviewed.  ?Constitutional:   ?   General: He is not in acute distress. ?Neck:  ?   Vascular: No JVD.  ?Cardiovascular:  ?   Rate and Rhythm: Normal rate and regular rhythm.  ?   Heart sounds: Normal heart sounds. No murmur heard. ?Pulmonary:  ?   Effort: Pulmonary effort is normal.  ?   Breath sounds: Normal breath sounds. No wheezing or rales.  ?Musculoskeletal:  ?   Right lower leg: No edema.  ?   Left lower leg: No edema.  ? ? ?  ICD-10-CM   ?1. Coronary artery disease involving native coronary artery of native heart without angina pectoris  I25.10   ?  ?2. Essential hypertension  I10 EKG 12-Lead  ?  losartan (COZAAR) 50 MG tablet  ?  Basic metabolic panel  ?  ? ?Meds ordered this encounter  ?Medications  ? losartan (COZAAR) 50 MG tablet  ?  Sig: Take 1 tablet (50 mg total) by mouth in the morning.  ?  Dispense:  90 tablet  ?  Refill:  3  ? ? ? ? ?   ?Assessment & Recommendations:  ? ?50 y/o [place] American man with hypertension, hyperlipidemia, nicotine dependence, CAD with NSTEMI 11/2020, h/o COVID positive (11/2020) ?  ?CAD: ?Currently, no angina symptoms ?NSTEMI 11/2020 ?Culprit LCx. Successful intervention mid LCx 2.75 X 26 mm Resolute drug-eluting stent, 2.5 X 15 mm Resolute drug-eluting stent ?RCA CTO non-culprit ?Recommend DAPT with Aspirin and Brilinta till 11/2021. Okay to stop Brilinta  after that and continue. Aspirin 81 mg daily.  ?Continue metoprolol succinate 25 mg daily, increase losartan to 50 mg daily. ?Check BMP in 1 week. ?Continue Crestor 20 mg daily. LDL 58 (05/2021) ? ?Hypertension: ?As above ? ?F/u in 6 months ? ? ?Manish J Patwardhan, MD ?Pager: [phone] ?Office: [phone] ?

## 2021-11-02 ENCOUNTER — Other Ambulatory Visit: Payer: Self-pay | Admitting: Cardiology

## 2021-11-02 DIAGNOSIS — I2510 Atherosclerotic heart disease of native coronary artery without angina pectoris: Secondary | ICD-10-CM

## 2021-12-01 ENCOUNTER — Other Ambulatory Visit: Payer: Self-pay | Admitting: Cardiology

## 2021-12-01 DIAGNOSIS — F17210 Nicotine dependence, cigarettes, uncomplicated: Secondary | ICD-10-CM

## 2021-12-01 MED ORDER — BUPROPION HCL ER (SR) 100 MG PO TB12: 100.0000 mg | ORAL_TABLET | Freq: Two times a day (BID) | ORAL | 3 refills | Status: AC

## 2021-12-01 NOTE — Progress Notes (Signed)
Brought to my attention by patient's daughter Harrell Gave patient is smoking again and would like to quit.  Nicotine patch has not worked in the past.  No history of seizures, depression, suicidal thoughts.  Recommend bupropion 1 mg twice daily.  Keep appointment in September. ? ?Nigel Mormon, MD ? ?

## 2021-12-26 ENCOUNTER — Encounter (HOSPITAL_BASED_OUTPATIENT_CLINIC_OR_DEPARTMENT_OTHER): Payer: Self-pay | Admitting: Emergency Medicine

## 2021-12-26 ENCOUNTER — Other Ambulatory Visit: Payer: Self-pay

## 2021-12-26 DIAGNOSIS — Z7982 Long term (current) use of aspirin: Secondary | ICD-10-CM | POA: Insufficient documentation

## 2021-12-26 DIAGNOSIS — E119 Type 2 diabetes mellitus without complications: Secondary | ICD-10-CM | POA: Insufficient documentation

## 2021-12-26 DIAGNOSIS — R1031 Right lower quadrant pain: Secondary | ICD-10-CM | POA: Diagnosis present

## 2021-12-26 DIAGNOSIS — Z79899 Other long term (current) drug therapy: Secondary | ICD-10-CM | POA: Diagnosis not present

## 2021-12-26 DIAGNOSIS — K5900 Constipation, unspecified: Secondary | ICD-10-CM | POA: Insufficient documentation

## 2021-12-26 DIAGNOSIS — Z7984 Long term (current) use of oral hypoglycemic drugs: Secondary | ICD-10-CM | POA: Diagnosis not present

## 2021-12-26 DIAGNOSIS — I10 Essential (primary) hypertension: Secondary | ICD-10-CM | POA: Insufficient documentation

## 2021-12-26 DIAGNOSIS — Z87891 Personal history of nicotine dependence: Secondary | ICD-10-CM | POA: Insufficient documentation

## 2021-12-26 LAB — URINALYSIS, ROUTINE W REFLEX MICROSCOPIC
Bilirubin Urine: NEGATIVE
Glucose, UA: NEGATIVE mg/dL
Hgb urine dipstick: NEGATIVE
Ketones, ur: NEGATIVE mg/dL
Leukocytes,Ua: NEGATIVE
Nitrite: NEGATIVE
Protein, ur: 30 mg/dL — AB
Specific Gravity, Urine: >=1.03 (ref 1.005–1.030)
pH: 5.5 (ref 5.0–8.0)

## 2021-12-26 LAB — COMPREHENSIVE METABOLIC PANEL
ALT: 24 U/L (ref 0–44)
AST: 28 U/L (ref 15–41)
Albumin: 4.3 g/dL (ref 3.5–5.0)
Alkaline Phosphatase: 58 U/L (ref 38–126)
Anion gap: 6 (ref 5–15)
BUN: 18 mg/dL (ref 6–20)
CO2: 24 mmol/L (ref 22–32)
Calcium: 8.8 mg/dL — ABNORMAL LOW (ref 8.9–10.3)
Chloride: 109 mmol/L (ref 98–111)
Creatinine, Ser: 0.94 mg/dL (ref 0.61–1.24)
GFR, Estimated: >60 mL/min (ref >60)
Glucose, Bld: 104 mg/dL — ABNORMAL HIGH (ref 70–99)
Potassium: 3.7 mmol/L (ref 3.5–5.1)
Sodium: 139 mmol/L (ref 135–145)
Total Bilirubin: 0.7 mg/dL (ref 0.3–1.2)
Total Protein: 7.1 g/dL (ref 6.5–8.1)

## 2021-12-26 LAB — CBC WITH DIFFERENTIAL/PLATELET
Abs Immature Granulocytes: 0.02 10*3/uL (ref 0.00–0.07)
Basophils Absolute: 0 10*3/uL (ref 0.0–0.1)
Basophils Relative: 1 %
Eosinophils Absolute: 0.1 10*3/uL (ref 0.0–0.5)
Eosinophils Relative: 2 %
HCT: 42.8 % (ref 39.0–52.0)
Hemoglobin: 15.1 g/dL (ref 13.0–17.0)
Immature Granulocytes: 0 %
Lymphocytes Relative: 34 %
Lymphs Abs: 2.2 10*3/uL (ref 0.7–4.0)
MCH: 30.2 pg (ref 26.0–34.0)
MCHC: 35.3 g/dL (ref 30.0–36.0)
MCV: 85.6 fL (ref 80.0–100.0)
Monocytes Absolute: 0.7 10*3/uL (ref 0.1–1.0)
Monocytes Relative: 11 %
Neutro Abs: 3.3 10*3/uL (ref 1.7–7.7)
Neutrophils Relative %: 52 %
Platelets: 147 10*3/uL — ABNORMAL LOW (ref 150–400)
RBC: 5 MIL/uL (ref 4.22–5.81)
RDW: 13.1 % (ref 11.5–15.5)
WBC: 6.4 10*3/uL (ref 4.0–10.5)
nRBC: 0 % (ref 0.0–0.2)

## 2021-12-26 LAB — URINALYSIS, MICROSCOPIC (REFLEX)

## 2021-12-26 LAB — LIPASE, BLOOD: Lipase: 40 U/L (ref 11–51)

## 2021-12-26 NOTE — ED Triage Notes (Signed)
Pt reports RLQ abdominal pain that radiates to the back x 1 week. Describes pain as constant and stabbing. Hx of constipation. Last BM 3 days ago. Denies n/v, fevers.

## 2021-12-27 ENCOUNTER — Emergency Department (HOSPITAL_BASED_OUTPATIENT_CLINIC_OR_DEPARTMENT_OTHER)
Admission: EM | Admit: 2021-12-27 | Discharge: 2021-12-27 | Disposition: A | Discharge status: Home / Self Care | Payer: Medicaid Other | Attending: Emergency Medicine | Admitting: Emergency Medicine

## 2021-12-27 ENCOUNTER — Emergency Department (HOSPITAL_BASED_OUTPATIENT_CLINIC_OR_DEPARTMENT_OTHER): Payer: Medicaid Other

## 2021-12-27 DIAGNOSIS — K5900 Constipation, unspecified: Secondary | ICD-10-CM

## 2021-12-27 DIAGNOSIS — R1031 Right lower quadrant pain: Secondary | ICD-10-CM

## 2021-12-27 MED ORDER — SODIUM CHLORIDE 0.9 % IV BOLUS
1000.0000 mL | Freq: Once | INTRAVENOUS | Status: AC
Start: 2021-12-27 — End: 2021-12-27
  Administered 2021-12-27: 1000 mL via INTRAVENOUS

## 2021-12-27 MED ORDER — IOHEXOL 300 MG/ML  SOLN
100.0000 mL | Freq: Once | INTRAMUSCULAR | Status: AC | PRN
  Administered 2021-12-27: 100 mL via INTRAVENOUS

## 2021-12-27 NOTE — ED Notes (Signed)
Patient to CT.

## 2021-12-27 NOTE — ED Provider Notes (Signed)
[facility] Provider Note: Georgena Spurling, MD, FACEP  CSN: [id number] MRN: [id number] ARRIVAL: 12/26/21 at 2245 ROOM: [place]  Abdominal Pain   HISTORY OF PRESENT ILLNESS  12/27/21 2:25 AM Randall Washington is a 51 y.o. male with 1 week of right lower quadrant abdominal pain radiating around to his back.  Pain is moderate and worse with ambulation.  It is constant and stabbing.  He has been constipated, his last bowel movement was 3 days ago.  He denies nausea or vomiting.  He has not had fever.  He denies urinary symptoms.   Past Medical History:  Diagnosis Date   Depression    Diabetes mellitus without complication (HCC)    GERD (gastroesophageal reflux disease)    History of kidney stones    Hyperlipidemia    Hypertension    Neuromuscular disorder (HCC)    neck and shoulder pain right side down to fingers and numbess, steriod shots   Tobacco abuse     Past Surgical History:  Procedure Laterality Date   CORONARY STENT INTERVENTION N/A 12/08/2020   Procedure: CORONARY STENT INTERVENTION;  Surgeon: Nigel Mormon, MD;  Location: [facility];  Service: Cardiovascular;  Laterality: N/A;   CYSTOSCOPY/RETROGRADE/URETEROSCOPY/STONE EXTRACTION WITH BASKET Left 06/13/2021   Procedure: CYSTOSCOPY/RETROGRADE/URETEROSCOPY/STONE EXTRACTION WITH BASKET/HOLMIUM LASER LITHOTRIPSY/ STENT PLACEMENT;  Surgeon: Festus Aloe, MD;  Location: [facility];  Service: Urology;  Laterality: Left;   LEFT HEART CATH AND CORONARY ANGIOGRAPHY N/A 12/07/2020   Procedure: LEFT HEART CATH AND CORONARY ANGIOGRAPHY;  Surgeon: Nigel Mormon, MD;  Location: [facility];  Service: Cardiovascular;  Laterality: N/A;   WISDOM TOOTH EXTRACTION      Family History  Problem Relation Age of Onset   Heart disease Father    Heart attack Father 28   Heart disease Brother    Heart disease Brother    Heart disease Brother    Heart disease Brother     Social  History   Tobacco Use   Smoking status: Former    Packs/day: 1.00    Years: 30.00    Pack years: 30.00    Types: Cigarettes    Quit date: 12/07/2020    Years since quitting: 1.0   Smokeless tobacco: Never   Tobacco comments:    will review   Vaping Use   Vaping Use: Never used  Substance Use Topics   Alcohol use: Never   Drug use: Never    Prior to Admission medications   Medication Sig Start Date End Date Taking? Authorizing Provider  aspirin 81 MG EC tablet Take 1 tablet (81 mg total) by mouth daily. Swallow whole. 12/22/20   Patwardhan, Reynold Bowen, MD  BRILINTA 90 MG TABS tablet Take 1 tablet by mouth twice daily 11/03/21   Patwardhan, Reynold Bowen, MD  buPROPion ER [facility] SR) 100 MG 12 hr tablet Take 1 tablet (100 mg total) by mouth 2 (two) times daily. 12/01/21   Patwardhan, Reynold Bowen, MD  losartan (COZAAR) 50 MG tablet Take 1 tablet (50 mg total) by mouth in the morning. 09/27/21   Patwardhan, Reynold Bowen, MD  metFORMIN (GLUCOPHAGE) 500 MG tablet Take 1 tablet (500 mg total) by mouth 2 (two) times daily with a meal. 04/05/21   Palumbo, April, MD  metoprolol succinate (TOPROL-XL) 25 MG 24 hr tablet Take 1 tablet (25 mg total) by mouth daily. 05/30/21   Patwardhan, Reynold Bowen, MD  nitroGLYCERIN (NITROSTAT) 0.4 MG SL tablet Place  1 tablet (0.4 mg total) under the tongue every 5 (five) minutes as needed for chest pain. 12/08/20 05/19/23  Patwardhan, Reynold Bowen, MD  rosuvastatin (CRESTOR) 20 MG tablet Take 20 mg by mouth daily.    [provider]    Allergies Patient has no known allergies.   REVIEW OF SYSTEMS  Negative except as noted here or in the History of Present Illness.   PHYSICAL EXAMINATION  Initial Vital Signs Blood pressure (!) 141/82, pulse 71, temperature 98.1 F (36.7 C), temperature source Oral, resp. rate 18, SpO2 97 %.  Examination General: Well-developed, well-nourished male in no acute distress; appearance consistent with age of record HENT: normocephalic;  atraumatic Eyes: Normal appearance Neck: supple Heart: regular rate and rhythm Lungs: clear to auscultation bilaterally Abdomen: soft; nondistended; right lower quadrant tenderness; bowel sounds present Extremities: No deformity; full range of motion; pulses normal Neurologic: Awake, alert; motor function intact in all extremities and symmetric; no facial droop Skin: Warm and dry Psychiatric: Normal mood and affect   RESULTS  Summary of this visit's results, reviewed and interpreted by myself:   EKG Interpretation  Date/Time:    Ventricular Rate:    PR Interval:    QRS Duration:   QT Interval:    QTC Calculation:   R Axis:     Text Interpretation:         Laboratory Studies: Results for orders placed or performed during the hospital encounter of 12/27/21 (from the past 24 hour(s))  Urinalysis, Routine w reflex microscopic Urine, Clean Catch     Status: Abnormal   Collection Time: 12/26/21 11:14 PM  Result Value Ref Range   Color, Urine YELLOW YELLOW   APPearance CLEAR CLEAR   Specific Gravity, Urine >=1.030 1.005 - 1.030   pH 5.5 5.0 - 8.0   Glucose, UA NEGATIVE NEGATIVE mg/dL   Hgb urine dipstick NEGATIVE NEGATIVE   Bilirubin Urine NEGATIVE NEGATIVE   Ketones, ur NEGATIVE NEGATIVE mg/dL   Protein, ur 30 (A) NEGATIVE mg/dL   Nitrite NEGATIVE NEGATIVE   Leukocytes,Ua NEGATIVE NEGATIVE  Urinalysis, Microscopic (reflex)     Status: Abnormal   Collection Time: 12/26/21 11:14 PM  Result Value Ref Range   RBC / HPF 0-5 0 - 5 RBC/hpf   WBC, UA 0-5 0 - 5 WBC/hpf   Bacteria, UA RARE (A) NONE SEEN   Squamous Epithelial / LPF 0-5 0 - 5   Mucus PRESENT   Lipase, blood     Status: None   Collection Time: 12/26/21 11:16 PM  Result Value Ref Range   Lipase 40 11 - 51 U/L  Comprehensive metabolic panel     Status: Abnormal   Collection Time: 12/26/21 11:16 PM  Result Value Ref Range   Sodium 139 135 - 145 mmol/L   Potassium 3.7 3.5 - 5.1 mmol/L   Chloride 109 98 - 111  mmol/L   CO2 24 22 - 32 mmol/L   Glucose, Bld 104 (H) 70 - 99 mg/dL   BUN 18 6 - 20 mg/dL   Creatinine, Ser 0.94 0.61 - 1.24 mg/dL   Calcium 8.8 (L) 8.9 - 10.3 mg/dL   Total Protein 7.1 6.5 - 8.1 g/dL   Albumin 4.3 3.5 - 5.0 g/dL   AST 28 15 - 41 U/L   ALT 24 0 - 44 U/L   Alkaline Phosphatase 58 38 - 126 U/L   Total Bilirubin 0.7 0.3 - 1.2 mg/dL   GFR, Estimated >60 >60 mL/min   Anion gap  6 5 - 15  CBC with Differential     Status: Abnormal   Collection Time: 12/26/21 11:16 PM  Result Value Ref Range   WBC 6.4 4.0 - 10.5 K/uL   RBC 5.00 4.22 - 5.81 MIL/uL   Hemoglobin 15.1 13.0 - 17.0 g/dL   HCT 42.8 39.0 - 52.0 %   MCV 85.6 80.0 - 100.0 fL   MCH 30.2 26.0 - 34.0 pg   MCHC 35.3 30.0 - 36.0 g/dL   RDW 13.1 11.5 - 15.5 %   Platelets 147 (L) 150 - 400 K/uL   nRBC 0.0 0.0 - 0.2 %   Neutrophils Relative % 52 %   Neutro Abs 3.3 1.7 - 7.7 K/uL   Lymphocytes Relative 34 %   Lymphs Abs 2.2 0.7 - 4.0 K/uL   Monocytes Relative 11 %   Monocytes Absolute 0.7 0.1 - 1.0 K/uL   Eosinophils Relative 2 %   Eosinophils Absolute 0.1 0.0 - 0.5 K/uL   Basophils Relative 1 %   Basophils Absolute 0.0 0.0 - 0.1 K/uL   Immature Granulocytes 0 %   Abs Immature Granulocytes 0.02 0.00 - 0.07 K/uL   Imaging Studies: CT ABDOMEN PELVIS W CONTRAST  Result Date: 12/27/2021 CLINICAL DATA:  Right lower quadrant pain for 1 week, initial encounter EXAM: CT ABDOMEN AND PELVIS WITH CONTRAST TECHNIQUE: Multidetector CT imaging of the abdomen and pelvis was performed using the standard protocol following bolus administration of intravenous contrast. RADIATION DOSE REDUCTION: This exam was performed according to the departmental dose-optimization program which includes automated exposure control, adjustment of the mA and/or kV according to patient size and/or use of iterative reconstruction technique. CONTRAST:  167m OMNIPAQUE IOHEXOL 300 MG/ML  SOLN COMPARISON:  09/01/2021 FINDINGS: Lower chest: No acute  abnormality. Hepatobiliary: No focal liver abnormality is seen. No gallstones, gallbladder wall thickening, or biliary dilatation. Pancreas: Unremarkable. No pancreatic ductal dilatation or surrounding inflammatory changes. Spleen: Normal in size without focal abnormality. Adrenals/Urinary Tract: Adrenal glands are within normal limits bilaterally. Kidneys demonstrate a normal enhancement pattern bilaterally. No renal calculi or obstructive changes are seen. A few small subcentimeter cysts are noted. No follow-up is recommended. No obstructive changes are seen. The bladder is partially distended. Stomach/Bowel: No obstructive or inflammatory changes of the colon are noted. The appendix is air-filled and within normal limits. No small bowel obstructive changes are seen. The stomach is unremarkable. Vascular/Lymphatic: Aortic atherosclerosis. No enlarged abdominal or pelvic lymph nodes. Reproductive: Prostate is unremarkable. Other: No abdominal wall hernia or abnormality. No abdominopelvic ascites. Musculoskeletal: No acute or significant osseous findings. IMPRESSION: No acute abnormality is noted correspond with the given clinical history. Electronically Signed   By: MInez CatalinaM.D.   On: 12/27/2021 03:08    ED COURSE and MDM  Nursing notes, initial and subsequent vitals signs, including pulse oximetry, reviewed and interpreted by myself.  Vitals:   12/26/21 2312  BP: (!) 141/82  Pulse: 71  Resp: 18  Temp: 98.1 F (36.7 C)  TempSrc: Oral  SpO2: 97%   Medications  sodium chloride 0.9 % bolus 1,000 mL (1,000 mLs Intravenous New Bag/Given 12/27/21 0301)  iohexol (OMNIPAQUE) 300 MG/ML solution 100 mL (100 mLs Intravenous Contrast Given 12/27/21 0240)   3:20 AM Patient and daughter advised of reassuring CT scan.  The cause of his constipation is unclear as he does not appear overly burden with stool on the CT.  He has tried multiple over-the-counter laxatives including MiraLAX, Ex-Lax and Dulcolax  without relief.  Because he is having some pain in his right lower back as well he may have a musculoskeletal component to his discomfort as well.   PROCEDURES  Procedures   ED DIAGNOSES     ICD-10-CM   1. Right lower quadrant abdominal pain  R10.31     2. Constipation, unspecified constipation type  K59.00          Dalon Reichart, Jenny Reichmann, MD 12/27/21 [phone]

## 2022-01-10 ENCOUNTER — Encounter: Payer: Self-pay | Admitting: Physician Assistant

## 2022-01-26 ENCOUNTER — Other Ambulatory Visit: Payer: Self-pay

## 2022-01-26 MED ORDER — ROSUVASTATIN CALCIUM 20 MG PO TABS: 20.0000 mg | ORAL_TABLET | Freq: Every day | ORAL | 0 refills | Status: DC

## 2022-02-06 ENCOUNTER — Ambulatory Visit: Payer: Medicaid Other | Admitting: Physician Assistant

## 2022-02-07 ENCOUNTER — Ambulatory Visit: Payer: Medicaid Other | Attending: Orthopedic Surgery | Admitting: Physical Therapy

## 2022-02-13 ENCOUNTER — Ambulatory Visit: Payer: Medicaid Other | Admitting: Physical Therapy

## 2022-02-16 ENCOUNTER — Ambulatory Visit: Payer: Medicaid Other | Admitting: Physical Therapy

## 2022-02-20 ENCOUNTER — Encounter: Payer: Medicaid Other | Admitting: Physical Therapy

## 2022-02-23 ENCOUNTER — Ambulatory Visit: Payer: Medicaid Other | Admitting: Gastroenterology

## 2022-02-23 ENCOUNTER — Other Ambulatory Visit: Payer: Self-pay | Admitting: Cardiology

## 2022-02-23 ENCOUNTER — Encounter: Payer: Self-pay | Admitting: Gastroenterology

## 2022-02-23 ENCOUNTER — Telehealth: Payer: Self-pay

## 2022-02-23 ENCOUNTER — Encounter: Payer: Self-pay | Admitting: Cardiology

## 2022-02-23 VITALS — BP 136/82 | HR 79 | Ht 64.96 in | Wt 202.0 lb

## 2022-02-23 DIAGNOSIS — K5904 Chronic idiopathic constipation: Secondary | ICD-10-CM

## 2022-02-23 DIAGNOSIS — Z1211 Encounter for screening for malignant neoplasm of colon: Secondary | ICD-10-CM

## 2022-02-23 DIAGNOSIS — R1031 Right lower quadrant pain: Secondary | ICD-10-CM

## 2022-02-23 MED ORDER — DICYCLOMINE HCL 10 MG PO CAPS
10.0000 mg | ORAL_CAPSULE | Freq: Three times a day (TID) | ORAL | 1 refills | Status: AC | PRN
Start: 2022-02-23 — End: ?

## 2022-02-23 MED ORDER — NA SULFATE-K SULFATE-MG SULF 17.5-3.13-1.6 GM/177ML PO SOLN: 1.0000 | Freq: Once | ORAL | 0 refills | Status: AC

## 2022-02-23 NOTE — Telephone Encounter (Signed)
Called and spoke with patient wife, she will relay the message.

## 2022-02-23 NOTE — Progress Notes (Signed)
Randall Washington    [id number]    Dec 10, 1970  Primary Care Physician:Osei-Bonsu, Iona Beard, MD  Referring Physician: Benito Mccreedy, MD [address],   [address]   Chief complaint:  Abdominal pain  HPI: 51 yr old very pleasant gentleman here for new patient visit with complaints of right lower quadrant abdominal pain and worsening constipation.  He started experiencing change in bowel habits in the past 2 months, was prescribed Linzess with some improvement initially but is currently not taking any because its not helping.  He was given a new prescription with higher dose which he has not started yet.  On average she has 1-2 small hard stool per day but he does not feel he is evacuating completely. He has constant right lower quadrant discomfort, slightly better compared to when he was in the ER in June of this year.  Denies any change in appetite, vomiting, unintentional weight loss, rectal bleeding or melena. No family history of colon cancer or inflammatory bowel disease.  CT abdomen pelvis with contrast December 27, 2021 No acute abnormality   Outpatient Encounter Medications as of 02/23/2022  Medication Sig   aspirin 81 MG EC tablet Take 1 tablet (81 mg total) by mouth daily. Swallow whole.   BRILINTA 90 MG TABS tablet Take 1 tablet by mouth twice daily   buPROPion ER (WELLBUTRIN SR) 100 MG 12 hr tablet Take 1 tablet (100 mg total) by mouth 2 (two) times daily.   losartan (COZAAR) 50 MG tablet Take 1 tablet (50 mg total) by mouth in the morning.   metFORMIN (GLUCOPHAGE) 500 MG tablet Take 1 tablet (500 mg total) by mouth 2 (two) times daily with a meal.   metoprolol succinate (TOPROL-XL) 25 MG 24 hr tablet Take 1 tablet (25 mg total) by mouth daily.   nitroGLYCERIN (NITROSTAT) 0.4 MG SL tablet Place 1 tablet (0.4 mg total) under the tongue every 5 (five) minutes as needed for chest pain.   rosuvastatin (CRESTOR) 20 MG tablet Take 1 tablet (20 mg  total) by mouth daily.   No facility-administered encounter medications on file as of 02/23/2022.    Allergies as of 02/23/2022   (No Known Allergies)    Past Medical History:  Diagnosis Date   Depression    Diabetes mellitus without complication (HCC)    GERD (gastroesophageal reflux disease)    History of kidney stones    Hyperlipidemia    Hypertension    Neuromuscular disorder (HCC)    neck and shoulder pain right side down to fingers and numbess, steriod shots   Tobacco abuse     Past Surgical History:  Procedure Laterality Date   CORONARY STENT INTERVENTION N/A 12/08/2020   Procedure: CORONARY STENT INTERVENTION;  Surgeon: Nigel Mormon, MD;  Location: [facility];  Service: Cardiovascular;  Laterality: N/A;   CYSTOSCOPY/RETROGRADE/URETEROSCOPY/STONE EXTRACTION WITH BASKET Left 06/13/2021   Procedure: CYSTOSCOPY/RETROGRADE/URETEROSCOPY/STONE EXTRACTION WITH BASKET/HOLMIUM LASER LITHOTRIPSY/ STENT PLACEMENT;  Surgeon: Festus Aloe, MD;  Location: [facility];  Service: Urology;  Laterality: Left;   LEFT HEART CATH AND CORONARY ANGIOGRAPHY N/A 12/07/2020   Procedure: LEFT HEART CATH AND CORONARY ANGIOGRAPHY;  Surgeon: Nigel Mormon, MD;  Location: [facility];  Service: Cardiovascular;  Laterality: N/A;   WISDOM TOOTH EXTRACTION      Family History  Problem Relation Age of Onset   Heart disease Father    Heart attack Father 50  Heart disease Brother    Heart disease Brother    Heart disease Brother    Heart disease Brother     Social History   Socioeconomic History   Marital status: Married    Spouse name: Not on file   Number of children: 4   Years of education: Not on file   Highest education level: Not on file  Occupational History   Not on file  Tobacco Use   Smoking status: Former    Packs/day: 1.00    Years: 30.00    Total pack years: 30.00    Types: Cigarettes    Quit date: 12/07/2020    Years since quitting: 1.2    Smokeless tobacco: Never   Tobacco comments:    will review   Vaping Use   Vaping Use: Never used  Substance and Sexual Activity   Alcohol use: Never   Drug use: Never   Sexual activity: Not on file  Other Topics Concern   Not on file  Social History Narrative   Not on file   Social Determinants of Health   Financial Resource Strain: Not on file  Food Insecurity: Not on file  Transportation Needs: Not on file  Physical Activity: Not on file  Stress: Not on file  Social Connections: Not on file  Intimate Partner Violence: Not on file      Review of systems: All other review of systems negative except as mentioned in the HPI.   Physical Exam: Vitals:   02/23/22 0958  BP: 136/82  Pulse: 79  SpO2: 97%   Body mass index is 33.65 kg/m. Gen:      No acute distress HEENT:  sclera anicteric Abd:      soft, right lower quadrant-tenderness; no palpable masses, no distension Ext:    No edema Neuro: alert and oriented x 3 Psych: normal mood and affect  Data Reviewed:  Reviewed labs, radiology imaging, old records and pertinent past GI work up   Assessment and Plan/Recommendations:  51 year old very pleasant gentleman months of right lower quadrant discomfort and recent change in bowel habits with constipation We will plan to proceed for further evaluation with colonoscopy, is past due for colorectal cancer screening  The risks and benefits as well as alternatives of endoscopic procedure(s) have been discussed and reviewed. All questions answered. The patient agrees to proceed.   Patient was provided samples for Linzess 290 mcg daily, advised him to call back if he develops diarrhea or decrease in effective Increase dietary fiber and water intake  Use dicyclomine 10 mg every 8 hours as needed for severe discomfort or cramping  Further recommendations based on colonoscopy findings   The patient was provided an opportunity to ask questions and all were answered. The  patient agreed with the plan and demonstrated an understanding of the instructions.  Damaris Hippo , MD    CC: Benito Mccreedy, MD

## 2022-02-23 NOTE — Telephone Encounter (Signed)
Patient can hold Aspirin for 5  day(s) prior to procedure and re-start 1  days post procedure. Patient has completed 1 year of Brilinta, and it can be stopped at this time.   [name], please let the patient know that he can stop Brilinta.  Thanks MJP

## 2022-02-23 NOTE — Patient Instructions (Signed)
We have sent the following medications to your pharmacy for you to pick up at your convenience:  Dicyclomine  You have been given samples of Linzess - take one 30 minutes before your first meal of the day.  You have been scheduled for a colonoscopy. Please follow written instructions given to you at your visit today.  Please pick up your prep supplies at the pharmacy within the next 1-3 days. If you use inhalers (even only as needed), please bring them with you on the day of your procedure.

## 2022-02-23 NOTE — Telephone Encounter (Signed)
   Cope Haddon [birth year] [id number]  Dear Virgina Jock:  We have scheduled the above named patient for a(n) endoscopic procedure. Our records show that (s)he is on anticoagulation therapy.  Please advise as to whether the patient may come off their therapy of Brilinta 5 days prior to their procedure which is scheduled for 03/21/2022.  Please route your response to [facility] or fax response to [phone].  Sincerely,    [facility]

## 2022-02-23 NOTE — Telephone Encounter (Signed)
Lm on daughter's voicemail

## 2022-02-26 ENCOUNTER — Encounter: Payer: Medicaid Other | Admitting: Physical Therapy

## 2022-02-27 NOTE — Telephone Encounter (Signed)
Patient's wife relayed message about blood thinner

## 2022-03-05 ENCOUNTER — Encounter: Payer: Medicaid Other | Admitting: Physical Therapy

## 2022-03-14 ENCOUNTER — Encounter: Payer: Self-pay | Admitting: Gastroenterology

## 2022-03-19 ENCOUNTER — Other Ambulatory Visit: Payer: Self-pay

## 2022-03-19 ENCOUNTER — Telehealth: Payer: Self-pay | Admitting: Gastroenterology

## 2022-03-19 MED ORDER — NA SULFATE-K SULFATE-MG SULF 17.5-3.13-1.6 GM/177ML PO SOLN: ORAL | 0 refills | Status: DC

## 2022-03-19 NOTE — Telephone Encounter (Signed)
Instructions and prep were sent in at his OV on 8/4. Left message letting pts daughter know that they were given at that time. Have sent suprep script to the pharmacy again and sent the prep instructions via mychart. Left detailed message for pts daughter on her voicemail.

## 2022-03-19 NOTE — Telephone Encounter (Signed)
Patient's daughter called with questions about upcoming colonoscopy.  Patient was told to hold his aspirin 5 days prior to procedure; however, he took one today.  Also, they have never received instructions or prep and will need those as procedure is on Wednesday.  Please call patient and advise.  Thank you.

## 2022-03-21 ENCOUNTER — Encounter: Payer: Medicaid Other | Admitting: Gastroenterology

## 2022-03-21 ENCOUNTER — Other Ambulatory Visit: Payer: Self-pay

## 2022-03-21 ENCOUNTER — Telehealth: Payer: Self-pay | Admitting: Gastroenterology

## 2022-03-21 MED ORDER — NA SULFATE-K SULFATE-MG SULF 17.5-3.13-1.6 GM/177ML PO SOLN: ORAL | 0 refills | Status: DC

## 2022-03-21 NOTE — Telephone Encounter (Signed)
Patients daughter called states they did not have the prep instructions nor prep available for today's procedure. They rescheduled for 04/06/22.

## 2022-03-21 NOTE — Telephone Encounter (Signed)
New instruction printed and mailed to the patient's listed address. The patient has an active My Chart and was last accessed 03/20/22 at 6:39 pm. Pharmacy listed corrected to show the preferred pharmacy.

## 2022-03-21 NOTE — Telephone Encounter (Signed)
Patients daughter called states she never received any voicemail nor prep from the pharmacy requesting to reschedule and have the prep sent to a [facility] on [address].

## 2022-03-30 ENCOUNTER — Ambulatory Visit: Payer: Medicaid Other | Admitting: Cardiology

## 2022-03-30 ENCOUNTER — Encounter: Payer: Self-pay | Admitting: Cardiology

## 2022-03-30 VITALS — BP 134/82 | HR 68 | Temp 98.0°F | Resp 16 | Ht 64.0 in | Wt 198.0 lb

## 2022-03-30 DIAGNOSIS — F17210 Nicotine dependence, cigarettes, uncomplicated: Secondary | ICD-10-CM

## 2022-03-30 DIAGNOSIS — I10 Essential (primary) hypertension: Secondary | ICD-10-CM

## 2022-03-30 DIAGNOSIS — I2510 Atherosclerotic heart disease of native coronary artery without angina pectoris: Secondary | ICD-10-CM

## 2022-03-30 NOTE — Progress Notes (Signed)
Follow up visit  Subjective:   Randall Washington, male    DOB: 1971/04/21, 51 y.o.   MRN: [id number]   HPI  Chief Complaint  Patient presents with   Coronary Artery Disease   Follow-up    27 month    51 y/o [place] American man with hypertension, hyperlipidemia, nicotine dependence, CAD with NSTEMI 11/2020, h/o COVID positive (11/2020)  Patient is here with his daughter, who assisted with language interpretation. He is doing well, denies chest pain, shortness of breath, palpitations, leg edema, orthopnea, PND, TIA/syncope.  Unfortunately, he continues to smoke 1 pack a day.  He was previously prescribed bupropion, but he is not currently taking it.   Current Outpatient Medications:    aspirin 81 MG EC tablet, Take 1 tablet (81 mg total) by mouth daily. Swallow whole., Disp: 90 tablet, Rfl: 3   buPROPion ER (WELLBUTRIN SR) 100 MG 12 hr tablet, Take 1 tablet (100 mg total) by mouth 2 (two) times daily., Disp: 60 tablet, Rfl: 3   dicyclomine (BENTYL) 10 MG capsule, Take 1 capsule (10 mg total) by mouth every 8 (eight) hours as needed for spasms., Disp: 90 capsule, Rfl: 1   losartan (COZAAR) 50 MG tablet, Take 1 tablet (50 mg total) by mouth in the morning., Disp: 90 tablet, Rfl: 3   metFORMIN (GLUCOPHAGE) 500 MG tablet, Take 1 tablet (500 mg total) by mouth 2 (two) times daily with a meal., Disp: 60 tablet, Rfl: 0   metoprolol succinate (TOPROL-XL) 25 MG 24 hr tablet, Take 1 tablet (25 mg total) by mouth daily., Disp: 90 tablet, Rfl: 3   Na Sulfate-K Sulfate-Mg Sulf 17.5-3.13-1.6 GM/177ML SOLN, Take as directed, Disp: 354 mL, Rfl: 0   nitroGLYCERIN (NITROSTAT) 0.4 MG SL tablet, Place 1 tablet (0.4 mg total) under the tongue every 5 (five) minutes as needed for chest pain., Disp: 30 tablet, Rfl: 3   rosuvastatin (CRESTOR) 20 MG tablet, Take 1 tablet (20 mg total) by mouth daily., Disp: 90 tablet, Rfl: 0  Cardiovascular & other pertient studies:  EKG 03/30/2022: Sinus rhythm 65 bpm  Old inferior  infarct  Coronary intervention 12/08/2020: LM: Normal LAD: Minimal luminal irregularities        Diag 1 ostial 50% stenosis Ramus: Ostial 40% stenosis LCx: Prox-mid tandem 90% stenoses, followed by a 40% stenosis RCA: Angiogram performed on 12/07/2020        Mid vessel occlusion        Left-to-right collaterals fiill distal RCA          Successful percutaneous coronary intervention Prox-Mid LCx        PTCA and overlapping stents placement         2.75 X 26 mm Resolute drug-eluting stent        2.5 X 15 mm Resolute drug-eluting stent   0% residual stenosis TIMI flow II-->III    Echocardiogram 12/07/2020:  1. Left ventricular ejection fraction, by estimation, is 50 to 55%. The  left ventricle has low normal function. The left ventricle has no regional  wall motion abnormalities. There is mild left ventricular hypertrophy.  Left ventricular diastolic  parameters are consistent with Grade I diastolic dysfunction (impaired  relaxation). The average left ventricular global longitudinal strain is  -15.7 %, which is mildly reduced.   2. Right ventricular systolic function is low normal. The right  ventricular size is normal.   3. No significant valvular abnormality.   4. The inferior vena cava is normal in size with greater than  50%  respiratory variability, suggesting right atrial pressure of 3 mmHg.      Recent labs:  12/26/2021: Glucose 104, BUN/Cr 18/0.94. EGFR >60. Na/K 139/3.7. Rest of the CMP normal H/H 15/42. MCV 85. Platelets 147  05/2021: HbA1C 6.3%  03/2021: Chol 131, TG 159, HDL 46, LDL 58    Review of Systems  Constitutional: Negative for malaise/fatigue.  Cardiovascular:  Negative for chest pain, dyspnea on exertion, leg swelling, palpitations and syncope.         Vitals:   03/30/22 0838 03/30/22 0845  BP: (!) 154/87 134/82  Pulse: 70 68  Resp: 16   Temp: 98 F (36.7 C)   SpO2: 96%     Body mass index is 33.99 kg/m. Filed Weights   03/30/22 0838   Weight: 198 lb (89.8 kg)     Objective:   Physical Exam Vitals and nursing note reviewed.  Constitutional:      General: He is not in acute distress. Neck:     Vascular: No JVD.  Cardiovascular:     Rate and Rhythm: Normal rate and regular rhythm.     Heart sounds: Normal heart sounds. No murmur heard. Pulmonary:     Effort: Pulmonary effort is normal.     Breath sounds: Normal breath sounds. No wheezing or rales.  Musculoskeletal:     Right lower leg: No edema.     Left lower leg: No edema.       ICD-10-CM   1. Cigarette nicotine dependence without complication  J67.341     2. Coronary artery disease involving native coronary artery of native heart without angina pectoris  I25.10 EKG 12-Lead     No orders of the defined types were placed in this encounter.        Assessment & Recommendations:   51 y/o [place] man with hypertension, hyperlipidemia, nicotine dependence, CAD with NSTEMI 11/2020, h/o COVID positive (11/2020)   CAD: Currently, no angina symptoms NSTEMI 11/2020 Culprit LCx. Successful intervention mid LCx 2.75 X 26 mm Resolute drug-eluting stent, 2.5 X 15 mm Resolute drug-eluting stent RCA CTO non-culprit Completed 1 year DAPT in 11/2021 Continue aspirin 81 mg daily, metoprolol succinate 25 mg daily, losartan to 50 mg daily, Crestor 20 mg daily.  Check lipid panel   Nicotine dependence:  Tobacco cessation counseling:  - Currently smoking 1 packs/day   - Patient was informed of the dangers of tobacco abuse including stroke, cancer, and MI, as well as benefits of tobacco cessation. - Patient is willing to quit at this time. - Approximately 5 mins were spent counseling patient cessation techniques. We discussed various methods to help quit smoking, including deciding on a date to quit, joining a support group, pharmacological agents. Patient would not like to use nicotine patches, but is willing to use Wellbutrin.  Even though he is on metoprolol,  his heart rate is not low, he is on low-dose metoprolol.  I think he will tolerate bupropion 100 mg twice daily fairly well.   - I will reassess his progress at the next follow-up visit  Hypertension: Fairly well-controlled  F/u in 3 months   Nigel Mormon, MD Pager: [phone] Office: [phone]

## 2022-04-06 ENCOUNTER — Ambulatory Visit (AMBULATORY_SURGERY_CENTER): Payer: Medicaid Other | Admitting: Gastroenterology

## 2022-04-06 ENCOUNTER — Encounter: Payer: Self-pay | Admitting: Gastroenterology

## 2022-04-06 VITALS — BP 127/70 | HR 60 | Temp 97.1°F | Resp 12 | Ht 64.0 in | Wt 202.0 lb

## 2022-04-06 DIAGNOSIS — K5904 Chronic idiopathic constipation: Secondary | ICD-10-CM | POA: Diagnosis not present

## 2022-04-06 DIAGNOSIS — K5730 Diverticulosis of large intestine without perforation or abscess without bleeding: Secondary | ICD-10-CM | POA: Diagnosis not present

## 2022-04-06 DIAGNOSIS — D123 Benign neoplasm of transverse colon: Secondary | ICD-10-CM

## 2022-04-06 DIAGNOSIS — K649 Unspecified hemorrhoids: Secondary | ICD-10-CM

## 2022-04-06 DIAGNOSIS — R1031 Right lower quadrant pain: Secondary | ICD-10-CM

## 2022-04-06 MED ORDER — SODIUM CHLORIDE 0.9 % IV SOLN: 500.0000 mL | Freq: Once | INTRAVENOUS | Status: DC

## 2022-04-06 NOTE — Progress Notes (Signed)
[facility] History and Physical   Primary Care Physician:  Benito Mccreedy, MD   Reason for Procedure:  Colorectal cancer screening, incidental RLQ abd pain and constipation  Plan:    Screening colonoscopy with possible interventions as needed     HPI: Vikash Pabst is a very pleasant 51 y.o. male here for screening colonoscopy.  Please refer to office visit note 02/23/22 for details  The risks and benefits as well as alternatives of endoscopic procedure(s) have been discussed and reviewed. All questions answered. The patient agrees to proceed.    Past Medical History:  Diagnosis Date   Depression    Diabetes mellitus without complication (HCC)    GERD (gastroesophageal reflux disease)    History of kidney stones    Hyperlipidemia    Hypertension    Neuromuscular disorder (HCC)    neck and shoulder pain right side down to fingers and numbess, steriod shots   Tobacco abuse     Past Surgical History:  Procedure Laterality Date   CORONARY STENT INTERVENTION N/A 12/08/2020   Procedure: CORONARY STENT INTERVENTION;  Surgeon: Nigel Mormon, MD;  Location: [facility];  Service: Cardiovascular;  Laterality: N/A;   CYSTOSCOPY/RETROGRADE/URETEROSCOPY/STONE EXTRACTION WITH BASKET Left 06/13/2021   Procedure: CYSTOSCOPY/RETROGRADE/URETEROSCOPY/STONE EXTRACTION WITH BASKET/HOLMIUM LASER LITHOTRIPSY/ STENT PLACEMENT;  Surgeon: Festus Aloe, MD;  Location: [facility];  Service: Urology;  Laterality: Left;   LEFT HEART CATH AND CORONARY ANGIOGRAPHY N/A 12/07/2020   Procedure: LEFT HEART CATH AND CORONARY ANGIOGRAPHY;  Surgeon: Nigel Mormon, MD;  Location: [facility];  Service: Cardiovascular;  Laterality: N/A;   WISDOM TOOTH EXTRACTION      Prior to Admission medications   Medication Sig Start Date End Date Taking? Authorizing Provider  aspirin 81 MG EC tablet Take 1 tablet (81 mg total) by mouth daily. Swallow whole. 12/22/20  Yes Patwardhan,  Manish J, MD  losartan (COZAAR) 50 MG tablet Take 1 tablet (50 mg total) by mouth in the morning. 09/27/21  Yes Patwardhan, Reynold Bowen, MD  metFORMIN (GLUCOPHAGE) 500 MG tablet Take 1 tablet (500 mg total) by mouth 2 (two) times daily with a meal. 04/05/21  Yes Palumbo, April, MD  metoprolol succinate (TOPROL-XL) 25 MG 24 hr tablet Take 1 tablet (25 mg total) by mouth daily. 05/30/21  Yes Patwardhan, Manish J, MD  buPROPion ER (WELLBUTRIN SR) 100 MG 12 hr tablet Take 1 tablet (100 mg total) by mouth 2 (two) times daily. 12/01/21   Patwardhan, Reynold Bowen, MD  dicyclomine (BENTYL) 10 MG capsule Take 1 capsule (10 mg total) by mouth every 8 (eight) hours as needed for spasms. 02/23/22   Mauri Pole, MD  nitroGLYCERIN (NITROSTAT) 0.4 MG SL tablet Place 1 tablet (0.4 mg total) under the tongue every 5 (five) minutes as needed for chest pain. 12/08/20 05/19/23  Patwardhan, Reynold Bowen, MD  rosuvastatin (CRESTOR) 20 MG tablet Take 1 tablet (20 mg total) by mouth daily. Patient not taking: Reported on 04/06/2022 01/26/22   Nigel Mormon, MD    Current Outpatient Medications  Medication Sig Dispense Refill   aspirin 81 MG EC tablet Take 1 tablet (81 mg total) by mouth daily. Swallow whole. 90 tablet 3   losartan (COZAAR) 50 MG tablet Take 1 tablet (50 mg total) by mouth in the morning. 90 tablet 3   metFORMIN (GLUCOPHAGE) 500 MG tablet Take 1 tablet (500 mg total) by mouth 2 (two) times daily with a meal. 60 tablet 0   metoprolol  succinate (TOPROL-XL) 25 MG 24 hr tablet Take 1 tablet (25 mg total) by mouth daily. 90 tablet 3   buPROPion ER (WELLBUTRIN SR) 100 MG 12 hr tablet Take 1 tablet (100 mg total) by mouth 2 (two) times daily. 60 tablet 3   dicyclomine (BENTYL) 10 MG capsule Take 1 capsule (10 mg total) by mouth every 8 (eight) hours as needed for spasms. 90 capsule 1   nitroGLYCERIN (NITROSTAT) 0.4 MG SL tablet Place 1 tablet (0.4 mg total) under the tongue every 5 (five) minutes as needed for chest  pain. 30 tablet 3   rosuvastatin (CRESTOR) 20 MG tablet Take 1 tablet (20 mg total) by mouth daily. (Patient not taking: Reported on 04/06/2022) 90 tablet 0   Current Facility-Administered Medications  Medication Dose Route Frequency Provider Last Rate Last Admin   0.9 %  sodium chloride infusion  500 mL Intravenous Once Mauri Pole, MD        Allergies as of 04/06/2022 - Review Complete 04/06/2022  Allergen Reaction Noted   No known allergies  04/06/2022    Family History  Problem Relation Age of Onset   Diabetes Mother    Heart disease Father    Heart attack Father 79   Diabetes Sister    Diabetes Sister    Diabetes Sister    Heart disease Brother    Diabetes Brother    Heart disease Brother    Diabetes Brother    Heart disease Brother    Diabetes Brother    Heart disease Brother    Diabetes Brother    Colon cancer Neg Hx    Stomach cancer Neg Hx    Esophageal cancer Neg Hx    Colon polyps Neg Hx     Social History   Socioeconomic History   Marital status: Married    Spouse name: Not on file   Number of children: 4   Years of education: Not on file   Highest education level: Not on file  Occupational History   Occupation: Dealer  Tobacco Use   Smoking status: Every Day    Packs/day: 1.00    Years: 30.00    Total pack years: 30.00    Types: Cigarettes   Smokeless tobacco: Never   Tobacco comments:    Stopped 12/07/2020, restarted in 06/2021  Vaping Use   Vaping Use: Never used  Substance and Sexual Activity   Alcohol use: Never   Drug use: Never   Sexual activity: Not on file  Other Topics Concern   Not on file  Social History Narrative   Not on file   Social Determinants of Health   Financial Resource Strain: Not on file  Food Insecurity: Not on file  Transportation Needs: Not on file  Physical Activity: Not on file  Stress: Not on file  Social Connections: Not on file  Intimate Partner Violence: Not on file    Review of  Systems:  All other review of systems negative except as mentioned in the HPI.  Physical Exam: Vital signs in last 24 hours: BP (!) 156/96 (BP Location: Right Arm, Patient Position: Sitting, Cuff Size: Normal)   Pulse 64   Temp (!) 97.1 F (36.2 C) (Temporal)   Ht '5\' 4"'$  (1.626 m)   Wt 202 lb (91.6 kg)   SpO2 98%   BMI 34.67 kg/m  General:   Alert, NAD Lungs:  Clear .   Heart:  Regular rate and rhythm Abdomen:  Soft, nontender and nondistended. Neuro/Psych:  Alert and cooperative. Normal mood and affect. A and O x 3  Reviewed labs, radiology imaging, old records and pertinent past GI work up  Patient is appropriate for planned procedure(s) and anesthesia in an ambulatory setting   K. Denzil Magnuson , MD [phone]

## 2022-04-06 NOTE — Progress Notes (Signed)
Vitals-AS  Pt's states no medical or surgical changes since previsit or office visit.  

## 2022-04-06 NOTE — Progress Notes (Signed)
Called to room to assist during endoscopic procedure.  Patient ID and intended procedure confirmed with present staff. Received instructions for my participation in the procedure from the performing physician.  

## 2022-04-06 NOTE — Patient Instructions (Signed)
Handouts Provided:  Polyps and diverticulosis  YOU HAD AN ENDOSCOPIC PROCEDURE TODAY AT [facility]:   Refer to the procedure report that was given to you for any specific questions about what was found during the examination.  If the procedure report does not answer your questions, please call your gastroenterologist to clarify.  If you requested that your care partner not be given the details of your procedure findings, then the procedure report has been included in a sealed envelope for you to review at your convenience later.  YOU SHOULD EXPECT: Some feelings of bloating in the abdomen. Passage of more gas than usual.  Walking can help get rid of the air that was put into your GI tract during the procedure and reduce the bloating. If you had a lower endoscopy (such as a colonoscopy or flexible sigmoidoscopy) you may notice spotting of blood in your stool or on the toilet paper. If you underwent a bowel prep for your procedure, you may not have a normal bowel movement for a few days.  Please Note:  You might notice some irritation and congestion in your nose or some drainage.  This is from the oxygen used during your procedure.  There is no need for concern and it should clear up in a day or so.  SYMPTOMS TO REPORT IMMEDIATELY:  Following lower endoscopy (colonoscopy or flexible sigmoidoscopy):  Excessive amounts of blood in the stool  Significant tenderness or worsening of abdominal pains  Swelling of the abdomen that is new, acute  Fever of 100F or higher  For urgent or emergent issues, a gastroenterologist can be reached at any hour by calling [phone]. Do not use MyChart messaging for urgent concerns.    DIET:  We do recommend a small meal at first, but then you may proceed to your regular diet.  Drink plenty of fluids but you should avoid alcoholic beverages for 24 hours.  ACTIVITY:  You should plan to take it easy for the rest of today and you should NOT DRIVE  or use heavy machinery until tomorrow (because of the sedation medicines used during the test).    FOLLOW UP: Our staff will call the number listed on your records the next business day following your procedure.  We will call around 7:15- 8:00 am to check on you and address any questions or concerns that you may have regarding the information given to you following your procedure. If we do not reach you, we will leave a message.     If any biopsies were taken you will be contacted by phone or by letter within the next 1-3 weeks.  Please call us at [phone] if you have not heard about the biopsies in 3 weeks.    SIGNATURES/CONFIDENTIALITY: You and/or your care partner have signed paperwork which will be entered into your electronic medical record.  These signatures attest to the fact that that the information above on your After Visit Summary has been reviewed and is understood.  Full responsibility of the confidentiality of this discharge information lies with you and/or your care-partner.

## 2022-04-06 NOTE — Op Note (Signed)
[facility] Patient Name: Randall Washington Procedure Date: 04/06/2022 3:25 PM MRN: [id number] Endoscopist: Mauri Pole , MD Age: 51 Referring MD:  Date of Birth: [birth year] Gender: Male Account #: [id number] Procedure:                Colonoscopy Indications:              Screening for colorectal malignant neoplasm,                            Incidental RLQ abdominal pain noted, Incidental                            constipation noted Medicines:                Monitored Anesthesia Care Procedure:                Pre-Anesthesia Assessment:                           - Prior to the procedure, a History and Physical                            was performed, and patient medications and                            allergies were reviewed. The patient's tolerance of                            previous anesthesia was also reviewed. The risks                            and benefits of the procedure and the sedation                            options and risks were discussed with the patient.                            All questions were answered, and informed consent                            was obtained. Prior Anticoagulants: The patient has                            taken no previous anticoagulant or antiplatelet                            agents. ASA Grade Assessment: III - A patient with                            severe systemic disease. After reviewing the risks                            and benefits, the patient was deemed in  satisfactory condition to undergo the procedure.                           After obtaining informed consent, the colonoscope                            was passed under direct vision. Throughout the                            procedure, the patient's blood pressure, pulse, and                            oxygen saturations were monitored continuously. The                            PCF-HQ190L Colonoscope was introduced  through the                            anus and advanced to the the cecum, identified by                            appendiceal orifice and ileocecal valve. The                            colonoscopy was performed without difficulty. The                            patient tolerated the procedure well. The quality                            of the bowel preparation was good. The ileocecal                            valve, appendiceal orifice, and rectum were                            photographed. Scope In: 3:39:32 PM Scope Out: 3:55:20 PM Scope Withdrawal Time: 0 hours 6 minutes 2 seconds  Total Procedure Duration: 0 hours 15 minutes 48 seconds  Findings:                 The perianal and digital rectal examinations were                            normal.                           Two sessile polyps were found in the transverse                            colon. The polyps were 7 to 10 mm in size. These                            polyps were removed with a cold snare. Resection  and retrieval were complete.                           A few small-mouthed diverticula were found in the                            sigmoid colon.                           Non-bleeding external and internal hemorrhoids were                            found during retroflexion. The hemorrhoids were                            medium-sized. Complications:            No immediate complications. Estimated Blood Loss:     Estimated blood loss was minimal. Impression:               - Two 7 to 10 mm polyps in the transverse colon,                            removed with a cold snare. Resected and retrieved.                           - Diverticulosis in the sigmoid colon.                           - Non-bleeding external and internal hemorrhoids. Recommendation:           - Patient has a contact number available for                            emergencies. The signs and symptoms of potential                             delayed complications were discussed with the                            patient. Return to normal activities tomorrow.                            Written discharge instructions were provided to the                            patient.                           - Resume previous diet.                           - Continue present medications.                           - Await pathology results.                           -  Repeat colonoscopy in 3 - 5 years for                            surveillance based on pathology results. Mauri Pole, MD 04/06/2022 4:00:53 PM This report has been signed electronically.

## 2022-04-06 NOTE — Progress Notes (Signed)
A and O x3. Report to RN. Tolerated MAC anesthesia well. 

## 2022-04-09 ENCOUNTER — Other Ambulatory Visit: Payer: Self-pay | Admitting: Orthopedic Surgery

## 2022-04-09 ENCOUNTER — Telehealth: Payer: Self-pay

## 2022-04-09 NOTE — Telephone Encounter (Signed)
  Follow up Call-     04/06/2022    3:07 PM 04/06/2022    3:01 PM  Call back number  Post procedure Call Back phone  # [phone]   Permission to leave phone message  Yes     Patient questions:  Do you have a fever, pain , or abdominal swelling? No. Pain Score  0 *  Have you tolerated food without any problems? Yes.    Have you been able to return to your normal activities? Yes.    Do you have any questions about your discharge instructions: Diet   No. Medications  No. Follow up visit  No.  Do you have questions or concerns about your Care? No.  Actions: * If pain score is 4 or above: No action needed, pain <4.

## 2022-04-11 ENCOUNTER — Other Ambulatory Visit: Payer: Self-pay

## 2022-04-11 ENCOUNTER — Other Ambulatory Visit: Payer: Self-pay | Admitting: Cardiology

## 2022-04-11 DIAGNOSIS — I10 Essential (primary) hypertension: Secondary | ICD-10-CM

## 2022-04-11 MED ORDER — LOSARTAN POTASSIUM 50 MG PO TABS: 50.0000 mg | ORAL_TABLET | Freq: Every morning | ORAL | 3 refills | Status: AC

## 2022-04-11 MED ORDER — METFORMIN HCL 500 MG PO TABS: 500.0000 mg | ORAL_TABLET | Freq: Two times a day (BID) | ORAL | 0 refills | Status: DC

## 2022-04-16 ENCOUNTER — Other Ambulatory Visit: Payer: Self-pay | Admitting: Orthopedic Surgery

## 2022-04-16 DIAGNOSIS — M25511 Pain in right shoulder: Secondary | ICD-10-CM

## 2022-04-26 ENCOUNTER — Encounter: Payer: Self-pay | Admitting: Gastroenterology

## 2022-04-30 ENCOUNTER — Other Ambulatory Visit: Payer: Self-pay

## 2022-04-30 MED ORDER — ROSUVASTATIN CALCIUM 20 MG PO TABS: 20.0000 mg | ORAL_TABLET | Freq: Every day | ORAL | 0 refills | Status: DC

## 2022-05-01 ENCOUNTER — Ambulatory Visit
Admission: RE | Admit: 2022-05-01 | Discharge: 2022-05-01 | Disposition: A | Discharge status: Home / Self Care | Payer: Medicaid Other | Source: Ambulatory Visit | Attending: Orthopedic Surgery | Admitting: Orthopedic Surgery

## 2022-05-01 DIAGNOSIS — M25511 Pain in right shoulder: Secondary | ICD-10-CM

## 2022-06-05 ENCOUNTER — Ambulatory Visit: Payer: Medicaid Other | Admitting: Gastroenterology

## 2022-06-12 ENCOUNTER — Ambulatory Visit: Payer: Medicaid Other | Attending: Orthopedic Surgery | Admitting: Physical Therapy

## 2022-06-19 ENCOUNTER — Ambulatory Visit: Payer: Medicaid Other | Admitting: Physical Therapy

## 2022-06-22 ENCOUNTER — Encounter: Payer: Medicaid Other | Admitting: Physical Therapy

## 2022-06-26 ENCOUNTER — Encounter: Payer: Medicaid Other | Admitting: Physical Therapy

## 2022-07-02 ENCOUNTER — Ambulatory Visit: Payer: Self-pay | Admitting: Cardiology

## 2022-07-02 NOTE — Progress Notes (Signed)
No show

## 2022-07-29 ENCOUNTER — Other Ambulatory Visit: Payer: Self-pay | Admitting: Cardiology

## 2022-12-07 IMAGING — CT CT RENAL STONE PROTOCOL
2 of 4 series · 17 of 46 positions shown, 19 images · non-contrast
Comparison: None.

CLINICAL DATA: Left flank pain and abdominal pain.  Dysuria.

EXAM:
CT ABDOMEN AND PELVIS WITHOUT CONTRAST
TECHNIQUE: Multidetector CT imaging of the abdomen and pelvis was performed
following the standard protocol without IV contrast.

[Series 2: axial st · axial · 0.86mm/px · z∈[+883,+1323]mm · 14 of 96 slices shown, 16 images]
[im 4/96  soft-tissue]
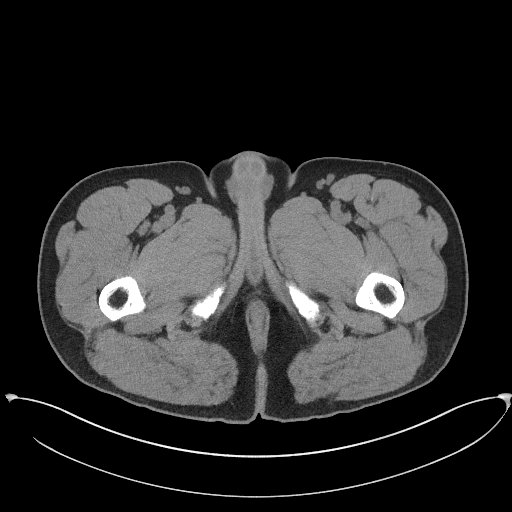
[im 4/96  bone]
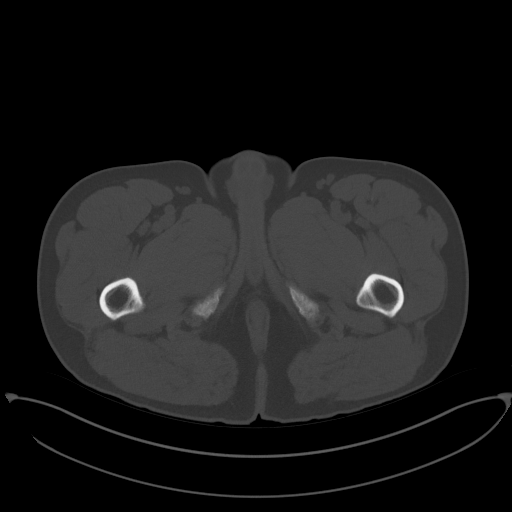
[im 12/96  soft-tissue]
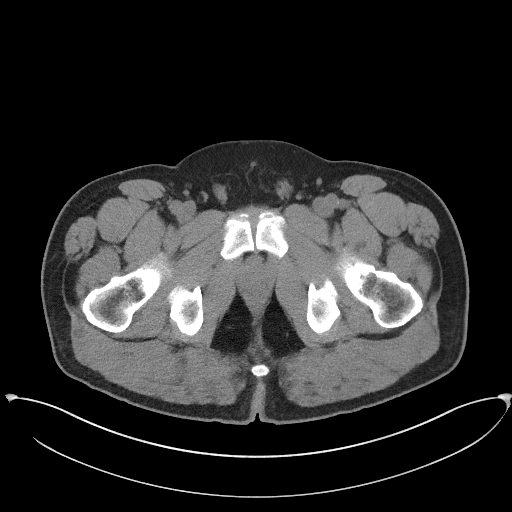
[im 20/96  soft-tissue]
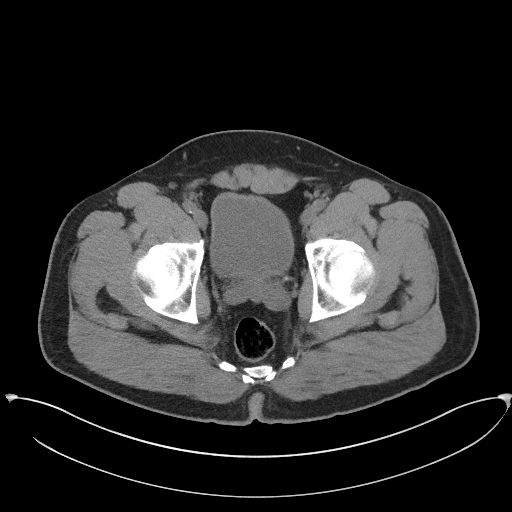
[im 24/96  soft-tissue]
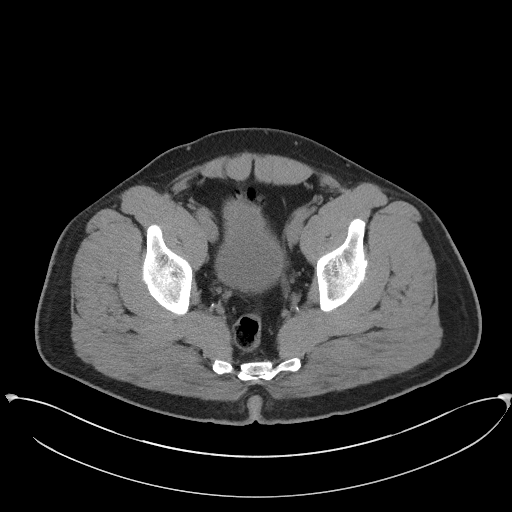
[im 32/96  soft-tissue]
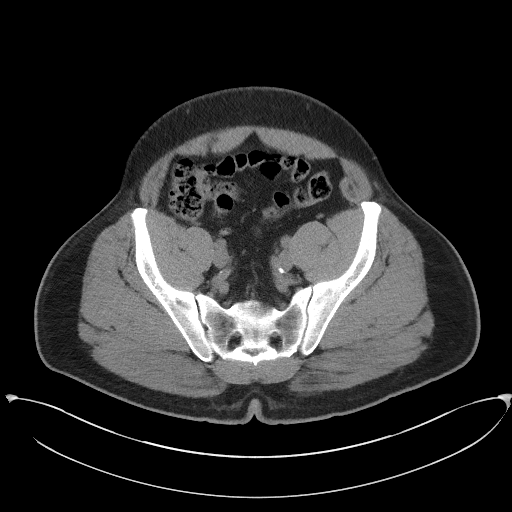
[im 40/96  soft-tissue]
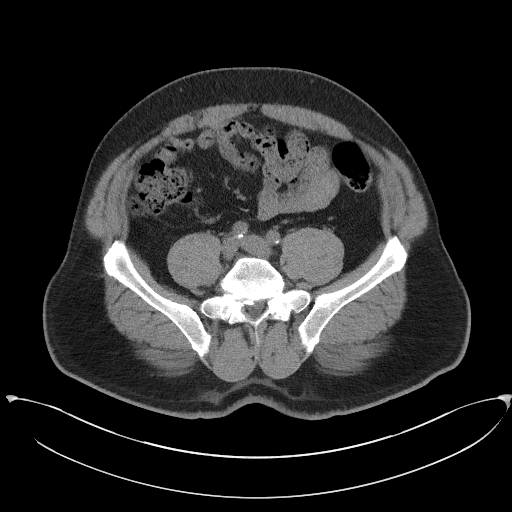
[im 44/96  soft-tissue]
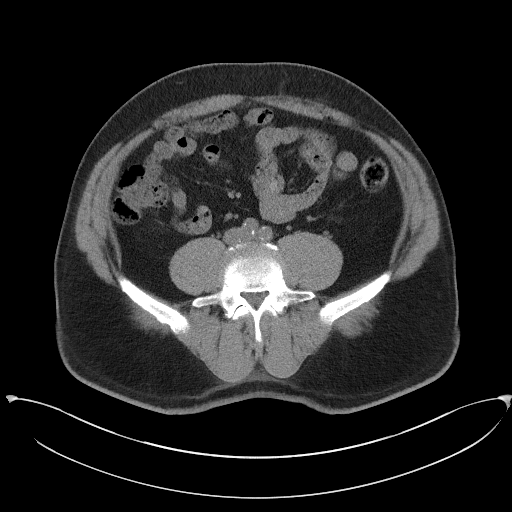
[im 52/96  soft-tissue]
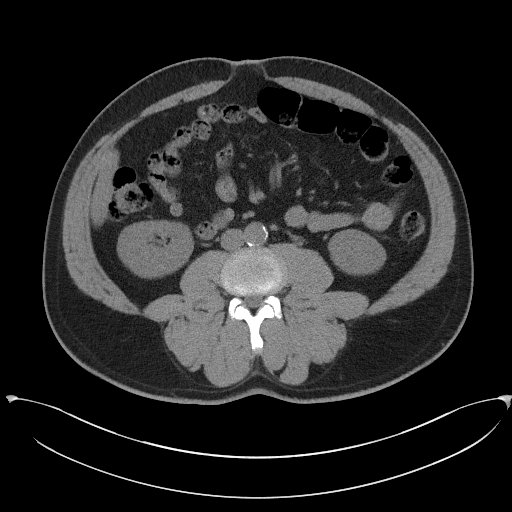
[im 56/96  soft-tissue]
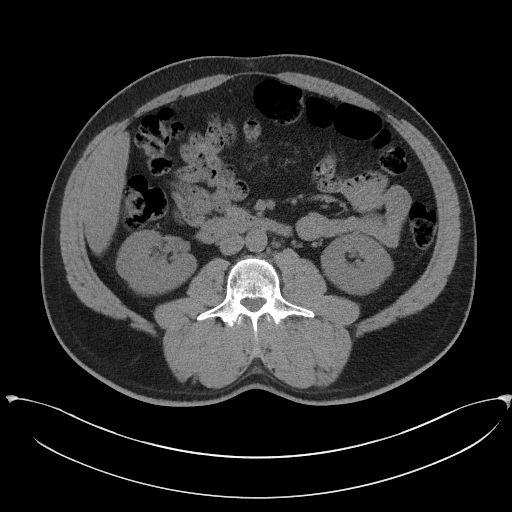
[im 56/96  bone]
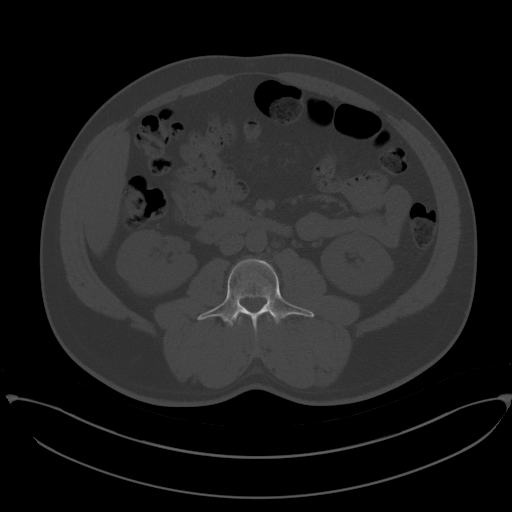
[im 64/96  soft-tissue]
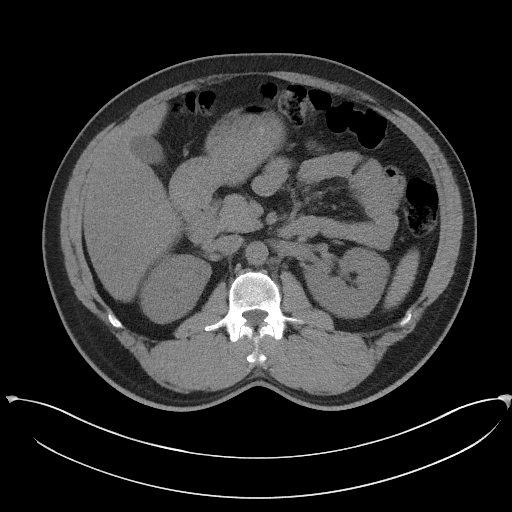
[im 72/96  soft-tissue]
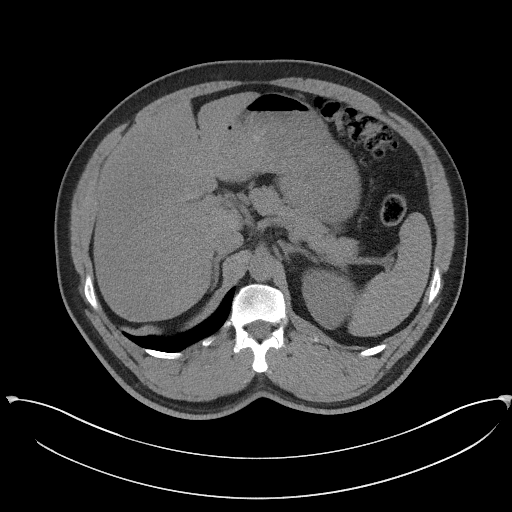
[im 76/96  soft-tissue]
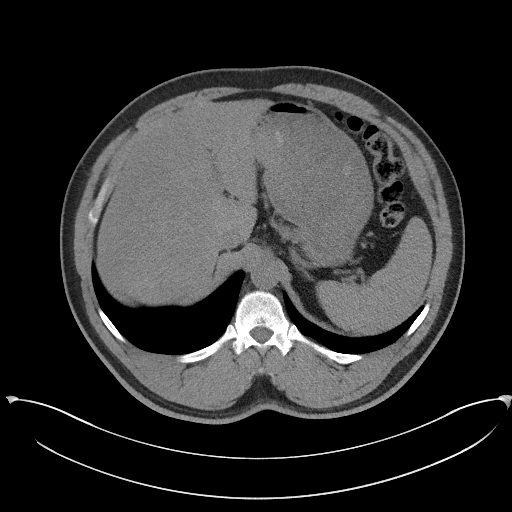
[im 84/96  soft-tissue]
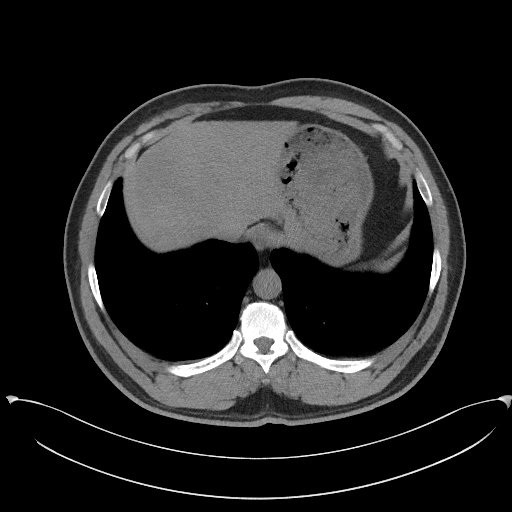
[im 92/96  soft-tissue]
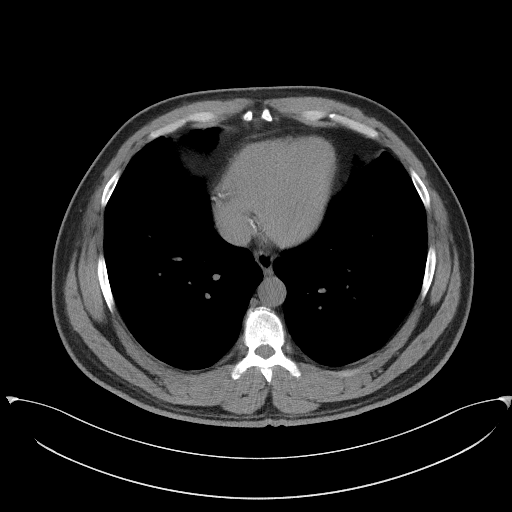

[Series 5: coronal st · coronal · 0.85mm/px · 3 of 108 slices shown]
[im 36/108  soft-tissue]
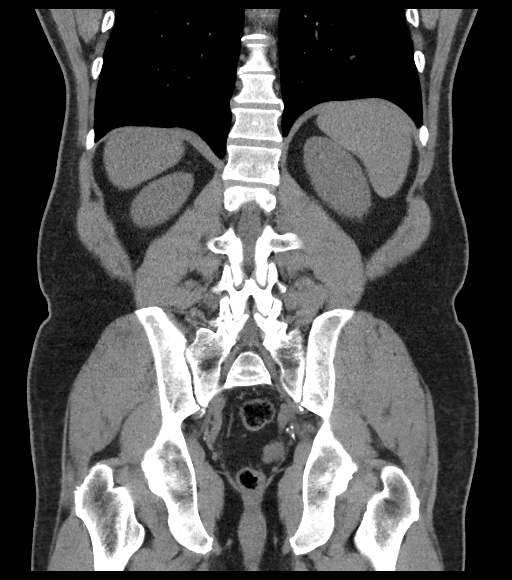
[im 48/108  soft-tissue]
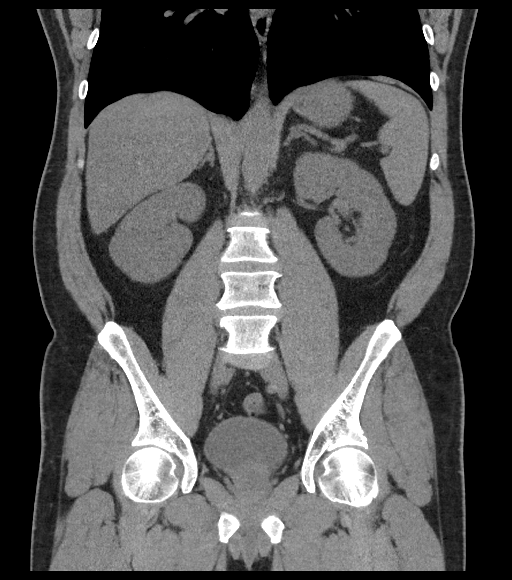
[im 60/108  soft-tissue]
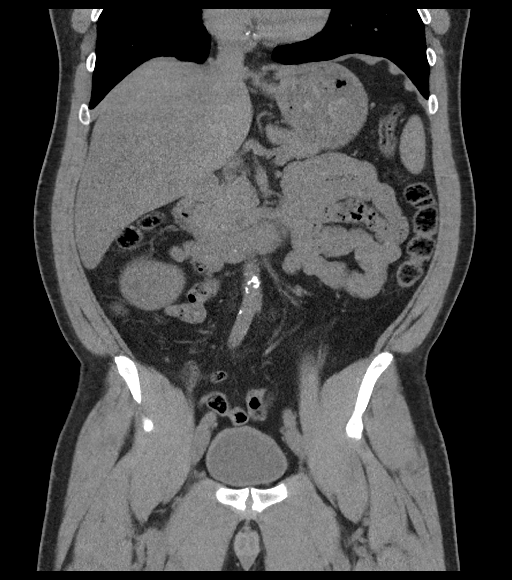

[17 of 46 positions shown; findings below may reference images not displayed]

FINDINGS: Lower chest: No acute abnormality.

Hepatobiliary: The liver is mildly enlarged. Gallbladder and bile
ducts are within normal limits.

Pancreas: Unremarkable. No pancreatic ductal dilatation or
surrounding inflammatory changes.

Spleen: Normal in size without focal abnormality.

Adrenals/Urinary Tract: There is a 7 mm calculus at the level of the
distal left ureter. There is no hydronephrosis. The kidneys
otherwise appear normal. Bladder appears within normal limits.

Stomach/Bowel: Stomach is within normal limits. Appendix appears
normal. No evidence of bowel wall thickening, distention, or
inflammatory changes.

Vascular/Lymphatic: Aortic atherosclerosis. No enlarged abdominal or
pelvic lymph nodes.

Reproductive: Prostate is unremarkable.

Other: There is a small fat containing umbilical hernia. No
abdominopelvic ascites.

Musculoskeletal: Degenerative changes affect the spine.
IMPRESSION: 1. There is a 7 mm calculus at the level of the distal left ureter.
There is no hydronephrosis.
2. Mild hepatomegaly.

## 2023-03-31 ENCOUNTER — Other Ambulatory Visit: Payer: Self-pay | Admitting: Cardiology

## 2023-06-06 ENCOUNTER — Other Ambulatory Visit: Payer: Self-pay

## 2023-06-06 MED ORDER — ROSUVASTATIN CALCIUM 20 MG PO TABS: 20.0000 mg | ORAL_TABLET | Freq: Every day | ORAL | 0 refills | Status: DC

## 2023-06-11 NOTE — Therapy (Signed)
OUTPATIENT PHYSICAL THERAPY THORACOLUMBAR EVALUATION   Patient Name: Randall Washington MRN: [id number] DOB:[birth year], 52 y.o., male Today's Date: 06/12/2023  END OF SESSION:  PT End of Session - 06/12/23 1318     Visit Number 1    Date for PT Re-Evaluation 08/07/23    Authorization Type [employer] requested 12 visits 11/25 to 08/07/23    PT Start Time 1320    PT Stop Time 1404    PT Time Calculation (min) 44 min    Activity Tolerance Patient tolerated treatment well    Behavior During Therapy WFL for tasks assessed/performed             Past Medical History:  Diagnosis Date   Depression    Diabetes mellitus without complication (HCC)    GERD (gastroesophageal reflux disease)    History of kidney stones    Hyperlipidemia    Hypertension    Neuromuscular disorder (HCC)    neck and shoulder pain right side down to fingers and numbess, steriod shots   Tobacco abuse    Past Surgical History:  Procedure Laterality Date   CORONARY STENT INTERVENTION N/A 12/08/2020   Procedure: CORONARY STENT INTERVENTION;  Surgeon: Elder Negus, MD;  Location: [facility];  Service: Cardiovascular;  Laterality: N/A;   CYSTOSCOPY/RETROGRADE/URETEROSCOPY/STONE EXTRACTION WITH BASKET Left 06/13/2021   Procedure: CYSTOSCOPY/RETROGRADE/URETEROSCOPY/STONE EXTRACTION WITH BASKET/HOLMIUM LASER LITHOTRIPSY/ STENT PLACEMENT;  Surgeon: Jerilee Field, MD;  Location: [facility];  Service: Urology;  Laterality: Left;   LEFT HEART CATH AND CORONARY ANGIOGRAPHY N/A 12/07/2020   Procedure: LEFT HEART CATH AND CORONARY ANGIOGRAPHY;  Surgeon: Elder Negus, MD;  Location: [facility];  Service: Cardiovascular;  Laterality: N/A;   WISDOM TOOTH EXTRACTION     Patient Active Problem List   Diagnosis Date Noted   Cigarette nicotine dependence without complication 03/30/2022   Coronary artery disease involving native coronary artery of native heart without angina pectoris 12/22/2020    NSTEMI (non-ST elevated myocardial infarction) (HCC) 12/07/2020   COVID-19 virus infection 12/07/2020   ACS (acute coronary syndrome) (HCC)    Essential hypertension 09/12/2018   Tobacco abuse 09/12/2018   Exertional chest pain 09/12/2018   Exertional dyspnea 09/12/2018    PCP: Jackie Plum, MD   REFERRING PROVIDER: Margart Sickles, PA-C   REFERRING DIAG: lumbago with radicular sx  Rationale for Evaluation and Treatment: Rehabilitation  THERAPY DIAG:  Radiculopathy, lumbar region - Plan: PT plan of care cert/re-cert  ONSET DATE: 2 months  SUBJECTIVE:  SUBJECTIVE STATEMENT: History of LBP x 3 years. Resolved but now it's back and way worse. Now into his R leg to his toes. Reports intermittent foot drop. Has been better with meds.  Daughter: Okey Regal  PERTINENT HISTORY:  HTN, DM, LBP, shoulder pain  PAIN:  Are you having pain? Yes: NPRS scale: 7/10 Pain location: R low back to toes; calf is the worst Pain description: dull radiating, pulling Aggravating factors: sitting and lying  Relieving factors: walking  PRECAUTIONS: None  RED FLAGS: None   WEIGHT BEARING RESTRICTIONS: No  FALLS:  Has patient fallen in last 6 months? No  LIVING ENVIRONMENT: Lives with: lives with their family Lives in: House/apartment Stairs: No Has following equipment at home: None  OCCUPATION: Curator  PLOF: Independent  PATIENT GOALS: get better  NEXT MD VISIT: 07/09/23  OBJECTIVE:  Note: Objective measures were completed at Evaluation unless otherwise noted.  DIAGNOSTIC FINDINGS:  When he returns to f/u with MD  PATIENT SURVEYS:  Modified Oswestry 16 / 50 = 32.0 %   COGNITION: Overall cognitive status: Within functional limits for tasks assessed     SENSATION: N/T in toes and  reports intermittent foot drop  MUSCLE LENGTH: B HS and piriformis tightness L>R,   POSTURE: forward head and increased lumbar lordosis  PALPATION: Decreased tone in R gluteals and painful, R lumbar/QL  Pain with L4/5 CPA, R L5 UPA Possible step off at L5  LUMBAR ROM: WNL increased rad pain with R SB, pain with extension, flexion   LOWER EXTREMITY ROM:   WNL except where noted with muscle length deficits.  LOWER EXTREMITY MMT:  Grossly 5/5 with MMT, but demonstrates functional weakness in the R LE with gait  LUMBAR SPECIAL TESTS:  Straight leg raise test: Negative, Slump test: Positive, and Quadrant test: Positive  Prone lying abolishes pain. Prone press ups and lumbar ext at wall increase back pain and tightness in HS.  GAIT: Distance walked: 40 Assistive device utilized: None Level of assistance: Complete Independence Comments: mild R Trendelenberg and Decreased toe raise R  TODAY'S TREATMENT:                                                                                                                              DATE:   06/12/23 See pt ed and HEP  If treatment provided at initial evaluation, no treatment charged due to lack of authorization.    PATIENT EDUCATION:  Education details: PT eval findings, anticipated POC, and initial HEP' Person educated: Patient and Child(ren) Education method: Explanation, Demonstration, and Handouts Education comprehension: verbalized understanding and returned demonstration  HOME EXERCISE PROGRAM: Access Code: [email or web address] URL: [email or web address] Date: 06/12/2023 Prepared by: Raynelle Fanning  Exercises - Lying Prone  - 3-4 x daily - 7 x weekly - 1 sets - 1 reps - 5 min hold - Seated Slump Nerve Glide  - 2 x daily - 7 x weekly - 1-2 sets - 10 reps - 3 sec  hold  ASSESSMENT:  CLINICAL IMPRESSION: Patient is a 52 y.o. male who was seen today for physical therapy evaluation and treatment for lumbago with R radicular symptoms  to his toes. He has increased pain with prolonged siting and relief with walking. This is somewhat inconsistent as his pain was abolished with prone lying today, but radicular pain increased upon standing. He states that pain is better when he sits in lumbar lordosis. At home he sits primarily on the couch. He has positive neural tension in the sciatic nerve and also reports increased radicular sx with piriformis stretching on the R. He will benefit from skilled PT to address the deficits below.   OBJECTIVE IMPAIRMENTS: Abnormal gait, decreased activity tolerance, decreased strength, increased muscle spasms, impaired flexibility, improper body mechanics, postural dysfunction, and pain.   ACTIVITY LIMITATIONS: carrying, lifting, bending, sitting, and sleeping  PARTICIPATION LIMITATIONS: driving and occupation  PERSONAL FACTORS: 1 comorbidity: DM  are also affecting patient's functional outcome.   REHAB POTENTIAL: Good  CLINICAL DECISION MAKING: Evolving/moderate complexity  EVALUATION COMPLEXITY: Moderate   GOALS: Goals reviewed with patient? Yes  SHORT TERM GOALS: Target date: 07/08/2023   Patient will be independent with initial HEP.  Baseline: no HEP Goal status: INITIAL  2.  Patient will report centralization of radicular symptoms.  Baseline: to R toes Goal status: INITIAL   LONG TERM GOALS: Target date: 08/12/2023   Patient will be independent with advanced/ongoing HEP to improve outcomes and carryover.  Baseline: initial HEP Goal status: INITIAL  2.  Patient will report 75% improvement in low back pain to improve QOL.  Baseline: 7/10 Goal status: INITIAL  3.  Patient will report 10 on Modified Oswestry to demonstrate improved functional ability.  Baseline: 16 / 50 = 32.0 % Goal status: INITIAL   4.  Patient will report increased tolerance with sitting to one hour to improve QOL. Baseline: <30 min Goal status: INITIAL  5.  Patient to demonstrate improved gait  pattern demonstrating improved functional strength of gluteus med and ant tibialis. Baseline: mild Trendelenberg and decreased DF Goal status: INITIAL  PLAN:  PT FREQUENCY: 2x/week  PT DURATION: other: 12 visits over 8 weeks  PLANNED INTERVENTIONS: 97164- PT Re-evaluation, 97110-Therapeutic exercises, 97530- Therapeutic activity, [id number]- Neuromuscular re-education, 97535- Self Care, [id number]- Manual therapy, [id number]- Gait training, [phone]- Aquatic Therapy, 97014- Electrical stimulation (unattended), [phone]- Traction (mechanical), Patient/Family education, Taping, Dry Needling, Joint mobilization, Spinal mobilization, Cryotherapy, and Moist heat.  PLAN FOR NEXT SESSION: Review and progress HEP, focus on lumbar stab and core, nerve glides, try extension protocol unless worsens sx, gluteus med and ant tib strength to improve gait, manual/DN prn, body mechanics and ADL modifications. Possible traction but cannot bill for it.    Solon Palm, PT  06/12/2023, 4:40 PM

## 2023-06-12 ENCOUNTER — Encounter: Payer: Self-pay | Admitting: Physical Therapy

## 2023-06-12 ENCOUNTER — Other Ambulatory Visit: Payer: Self-pay

## 2023-06-12 ENCOUNTER — Ambulatory Visit: Payer: Medicaid Other | Attending: Physician Assistant | Admitting: Physical Therapy

## 2023-06-12 DIAGNOSIS — M5416 Radiculopathy, lumbar region: Secondary | ICD-10-CM | POA: Insufficient documentation

## 2023-06-18 ENCOUNTER — Ambulatory Visit: Payer: Medicaid Other

## 2023-06-18 DIAGNOSIS — M5416 Radiculopathy, lumbar region: Secondary | ICD-10-CM

## 2023-06-18 NOTE — Therapy (Signed)
OUTPATIENT PHYSICAL THERAPY THORACOLUMBAR TREATMENT   Patient Name: Randall Washington MRN: [id number] DOB:1971/01/27, 52 y.o., male Today's Date: 06/18/2023  END OF SESSION:  PT End of Session - 06/18/23 1111     Visit Number 2    Date for PT Re-Evaluation 08/07/23    Authorization Type [employer] requested 12 visits 11/25 to 08/07/23    Authorization - Visit Number 1    Authorization - Number of Visits 5    PT Start Time 1107   pt late   PT Stop Time 1145    PT Time Calculation (min) 38 min    Activity Tolerance Patient tolerated treatment well    Behavior During Therapy WFL for tasks assessed/performed              Past Medical History:  Diagnosis Date   Depression    Diabetes mellitus without complication (HCC)    GERD (gastroesophageal reflux disease)    History of kidney stones    Hyperlipidemia    Hypertension    Neuromuscular disorder (HCC)    neck and shoulder pain right side down to fingers and numbess, steriod shots   Tobacco abuse    Past Surgical History:  Procedure Laterality Date   CORONARY STENT INTERVENTION N/A 12/08/2020   Procedure: CORONARY STENT INTERVENTION;  Surgeon: Elder Negus, MD;  Location: [facility];  Service: Cardiovascular;  Laterality: N/A;   CYSTOSCOPY/RETROGRADE/URETEROSCOPY/STONE EXTRACTION WITH BASKET Left 06/13/2021   Procedure: CYSTOSCOPY/RETROGRADE/URETEROSCOPY/STONE EXTRACTION WITH BASKET/HOLMIUM LASER LITHOTRIPSY/ STENT PLACEMENT;  Surgeon: Jerilee Field, MD;  Location: [facility];  Service: Urology;  Laterality: Left;   LEFT HEART CATH AND CORONARY ANGIOGRAPHY N/A 12/07/2020   Procedure: LEFT HEART CATH AND CORONARY ANGIOGRAPHY;  Surgeon: Elder Negus, MD;  Location: [facility];  Service: Cardiovascular;  Laterality: N/A;   WISDOM TOOTH EXTRACTION     Patient Active Problem List   Diagnosis Date Noted   Cigarette nicotine dependence without complication 03/30/2022   Coronary artery disease  involving native coronary artery of native heart without angina pectoris 12/22/2020   NSTEMI (non-ST elevated myocardial infarction) (HCC) 12/07/2020   COVID-19 virus infection 12/07/2020   ACS (acute coronary syndrome) (HCC)    Essential hypertension 09/12/2018   Tobacco abuse 09/12/2018   Exertional chest pain 09/12/2018   Exertional dyspnea 09/12/2018    PCP: Jackie Plum, MD   REFERRING PROVIDER: Margart Sickles, PA-C   REFERRING DIAG: lumbago with radicular sx  Rationale for Evaluation and Treatment: Rehabilitation  THERAPY DIAG:  Radiculopathy, lumbar region  ONSET DATE: 2 months  SUBJECTIVE:  SUBJECTIVE STATEMENT: Pt reports that his pain is better today.   Daughter: Okey Regal  PERTINENT HISTORY:  HTN, DM, LBP, shoulder pain  PAIN:  Are you having pain? Yes: NPRS scale: 4/10 Pain location: R low back to toes; calf is the worst Pain description: dull radiating, pulling Aggravating factors: sitting and lying  Relieving factors: walking  PRECAUTIONS: None  RED FLAGS: None   WEIGHT BEARING RESTRICTIONS: No  FALLS:  Has patient fallen in last 6 months? No  LIVING ENVIRONMENT: Lives with: lives with their family Lives in: House/apartment Stairs: No Has following equipment at home: None  OCCUPATION: Curator  PLOF: Independent  PATIENT GOALS: get better  NEXT MD VISIT: 07/09/23  OBJECTIVE:  Note: Objective measures were completed at Evaluation unless otherwise noted.  DIAGNOSTIC FINDINGS:  When he returns to f/u with MD  PATIENT SURVEYS:  Modified Oswestry 16 / 50 = 32.0 %   COGNITION: Overall cognitive status: Within functional limits for tasks assessed     SENSATION: N/T in toes and reports intermittent foot drop  MUSCLE LENGTH: B HS and piriformis  tightness L>R,   POSTURE: forward head and increased lumbar lordosis  PALPATION: Decreased tone in R gluteals and painful, R lumbar/QL  Pain with L4/5 CPA, R L5 UPA Possible step off at L5  LUMBAR ROM: WNL increased rad pain with R SB, pain with extension, flexion   LOWER EXTREMITY ROM:   WNL except where noted with muscle length deficits.  LOWER EXTREMITY MMT:  Grossly 5/5 with MMT, but demonstrates functional weakness in the R LE with gait  LUMBAR SPECIAL TESTS:  Straight leg raise test: Negative, Slump test: Positive, and Quadrant test: Positive  Prone lying abolishes pain. Prone press ups and lumbar ext at wall increase back pain and tightness in HS.  GAIT: Distance walked: 40 Assistive device utilized: None Level of assistance: Complete Independence Comments: mild R Trendelenberg and Decreased toe raise R  TODAY'S TREATMENT:                                                                                                                              DATE: 06/18/23 Recumbent bike L2x24min Prone knee flexion x 10 BLE Prone on elbows 2 x 30 sec Prone press up x 5  Supine LTR both way x 10  Supine figure 4 and KTOS stretch x 30 sec B  STM to R glutes, TPR in R piriformis  06/12/23 See pt ed and HEP  If treatment provided at initial evaluation, no treatment charged due to lack of authorization.    PATIENT EDUCATION:  Education details: PT eval findings, anticipated POC, and initial HEP' Person educated: Patient and Child(ren) Education method: Explanation, Demonstration, and Handouts Education comprehension: verbalized understanding and returned demonstration  HOME EXERCISE PROGRAM: Access Code: [email or web address]   ASSESSMENT:  CLINICAL IMPRESSION: Pt arrived with decreased pain today. Able to progress with extension exercises with no increased pain. He does continue to have reports  of sciatic symptoms in RLE. He has tension and TrP in the piriformis muscle, improved after  manual. He continues to benefit from skilled therapy to reduce radicular pain and restore function.  OBJECTIVE IMPAIRMENTS: Abnormal gait, decreased activity tolerance, decreased strength, increased muscle spasms, impaired flexibility, improper body mechanics, postural dysfunction, and pain.   ACTIVITY LIMITATIONS: carrying, lifting, bending, sitting, and sleeping  PARTICIPATION LIMITATIONS: driving and occupation  PERSONAL FACTORS: 1 comorbidity: DM  are also affecting patient's functional outcome.   REHAB POTENTIAL: Good  CLINICAL DECISION MAKING: Evolving/moderate complexity  EVALUATION COMPLEXITY: Moderate   GOALS: Goals reviewed with patient? Yes  SHORT TERM GOALS: Target date: 07/08/2023   Patient will be independent with initial HEP.  Baseline: no HEP Goal status: IN PROGRESS  2.  Patient will report centralization of radicular symptoms.  Baseline: to R toes Goal status: IN PROGRESS   LONG TERM GOALS: Target date: 08/12/2023   Patient will be independent with advanced/ongoing HEP to improve outcomes and carryover.  Baseline: initial HEP Goal status: IN PROGRESS  2.  Patient will report 75% improvement in low back pain to improve QOL.  Baseline: 7/10 Goal status: IN PROGRESS  3.  Patient will report 10 on Modified Oswestry to demonstrate improved functional ability.  Baseline: 16 / 50 = 32.0 % Goal status: IN PROGRESS   4.  Patient will report increased tolerance with sitting to one hour to improve QOL. Baseline: <30 min Goal status: IN PROGRESS  5.  Patient to demonstrate improved gait pattern demonstrating improved functional strength of gluteus med and ant tibialis. Baseline: mild Trendelenberg and decreased DF Goal status: IN PROGRESS  PLAN:  PT FREQUENCY: 2x/week  PT DURATION: other: 12 visits over 8 weeks  PLANNED INTERVENTIONS: 97164- PT Re-evaluation, 97110-Therapeutic exercises, 97530- Therapeutic activity, [id number]- Neuromuscular re-education,  97535- Self Care, [id number]- Manual therapy, [id number]- Gait training, [phone]- Aquatic Therapy, 97014- Electrical stimulation (unattended), [phone]- Traction (mechanical), Patient/Family education, Taping, Dry Needling, Joint mobilization, Spinal mobilization, Cryotherapy, and Moist heat.  PLAN FOR NEXT SESSION: focus on lumbar stab and core, nerve glides, try extension protocol unless worsens sx, gluteus med and ant tib strength to improve gait, manual/DN prn, body mechanics and ADL modifications. Possible traction but cannot bill for it.    Darleene Cleaver, PTA   06/18/2023, 12:45 PM

## 2023-06-24 ENCOUNTER — Ambulatory Visit: Payer: Medicaid Other

## 2023-06-27 ENCOUNTER — Ambulatory Visit: Payer: Medicaid Other | Attending: Physician Assistant

## 2023-06-27 DIAGNOSIS — M5416 Radiculopathy, lumbar region: Secondary | ICD-10-CM | POA: Insufficient documentation

## 2023-07-01 ENCOUNTER — Encounter: Payer: Self-pay | Admitting: Physical Therapy

## 2023-07-01 ENCOUNTER — Ambulatory Visit: Payer: Medicaid Other | Admitting: Physical Therapy

## 2023-07-01 DIAGNOSIS — M5416 Radiculopathy, lumbar region: Secondary | ICD-10-CM

## 2023-07-01 NOTE — Therapy (Addendum)
 OUTPATIENT PHYSICAL THERAPY TREATMENT / DISCHARGE SUMMARY   Patient Name: Randall Washington MRN: [id number] DOB:1970-09-04, 52 y.o., male Today's Date: 07/01/2023  END OF SESSION:  PT End of Session - 07/01/23 1148     Visit Number 3    Date for PT Re-Evaluation 08/07/23    Authorization Type [employer] requested 12 visits 11/25 to 08/07/23    Authorization Time Period 06/12/23 - 08/10/23    Authorization - Visit Number 2    Authorization - Number of Visits 5    PT Start Time 1148    PT Stop Time 1232    PT Time Calculation (min) 44 min    Activity Tolerance Patient tolerated treatment well    Behavior During Therapy WFL for tasks assessed/performed               Past Medical History:  Diagnosis Date   Depression    Diabetes mellitus without complication (HCC)    GERD (gastroesophageal reflux disease)    History of kidney stones    Hyperlipidemia    Hypertension    Neuromuscular disorder (HCC)    neck and shoulder pain right side down to fingers and numbess, steriod shots   Tobacco abuse    Past Surgical History:  Procedure Laterality Date   CORONARY STENT INTERVENTION N/A 12/08/2020   Procedure: CORONARY STENT INTERVENTION;  Surgeon: Cody Das, MD;  Location: [facility];  Service: Cardiovascular;  Laterality: N/A;   CYSTOSCOPY/RETROGRADE/URETEROSCOPY/STONE EXTRACTION WITH BASKET Left 06/13/2021   Procedure: CYSTOSCOPY/RETROGRADE/URETEROSCOPY/STONE EXTRACTION WITH BASKET/HOLMIUM LASER LITHOTRIPSY/ STENT PLACEMENT;  Surgeon: Christina Coyer, MD;  Location: [facility];  Service: Urology;  Laterality: Left;   LEFT HEART CATH AND CORONARY ANGIOGRAPHY N/A 12/07/2020   Procedure: LEFT HEART CATH AND CORONARY ANGIOGRAPHY;  Surgeon: Cody Das, MD;  Location: [facility];  Service: Cardiovascular;  Laterality: N/A;   WISDOM TOOTH EXTRACTION     Patient Active Problem List   Diagnosis Date Noted   Cigarette nicotine  dependence without  complication 03/30/2022   Coronary artery disease involving native coronary artery of native heart without angina pectoris 12/22/2020   NSTEMI (non-ST elevated myocardial infarction) (HCC) 12/07/2020   COVID-19 virus infection 12/07/2020   ACS (acute coronary syndrome) (HCC)    Essential hypertension 09/12/2018   Tobacco abuse 09/12/2018   Exertional chest pain 09/12/2018   Exertional dyspnea 09/12/2018    PCP: Tretha Fu, MD  REFERRING PROVIDER: Lucie Ruts, PA-C  REFERRING DIAG: lumbago with radicular sx  RATIONALE FOR EVALUATION AND TREATMENT: Rehabilitation  THERAPY DIAG:  Radiculopathy, lumbar region  ONSET DATE: 2 months  NEXT MD VISIT: 07/09/23   SUBJECTIVE:  SUBJECTIVE STATEMENT: Pt denies pain today.   Daughter: Randall Washington  - assists with interpretation.  PERTINENT HISTORY:  HTN, DM, LBP, shoulder pain  PAIN:  Are you having pain? Yes: NPRS scale:  0/10 Pain location: R low back to toes; calf is the worst Pain description: dull radiating, pulling Aggravating factors: sitting and lying  Relieving factors: walking  PRECAUTIONS: None  RED FLAGS: None   WEIGHT BEARING RESTRICTIONS: No  FALLS:  Has patient fallen in last 6 months? No  LIVING ENVIRONMENT: Lives with: lives with their family Lives in: House/apartment Stairs: No Has following equipment at home: None  OCCUPATION: Curator  PLOF: Independent  PATIENT GOALS: get better   OBJECTIVE:  Note: Objective measures were completed at Evaluation unless otherwise noted.  DIAGNOSTIC FINDINGS:  When he returns to f/u with MD  PATIENT SURVEYS:  Modified Oswestry 16 / 50 = 32.0 %   COGNITION: Overall cognitive status: Within functional limits for tasks assessed     SENSATION: N/T in toes and  reports intermittent foot drop  MUSCLE LENGTH: B HS and piriformis tightness L>R,   POSTURE: forward head and increased lumbar lordosis  PALPATION: Decreased tone in R gluteals and painful, R lumbar/QL  Pain with L4/5 CPA, R L5 UPA Possible step off at L5  LUMBAR ROM: WNL increased rad pain with R SB, pain with extension, flexion  LOWER EXTREMITY ROM:   WNL except where noted with muscle length deficits.  LOWER EXTREMITY MMT:  Grossly 5/5 with MMT, but demonstrates functional weakness in the R LE with gait  LUMBAR SPECIAL TESTS:  Straight leg raise test: Negative, Slump test: Positive, and Quadrant test: Positive  Prone lying abolishes pain. Prone press ups and lumbar ext at wall increase back pain and tightness in HS.  GAIT: Distance walked: 40 Assistive device utilized: None Level of assistance: Complete Independence Comments: mild R Trendelenberg and Decreased toe raise R   TODAY'S TREATMENT:                                                                                                                              DATE:  07/01/23 THERAPEUTIC EXERCISE: to improve flexibility, strength and mobility.  Demonstration, verbal and tactile cues throughout for technique.  TM 3.0 mph x 6 min - pt reports LBP & R LE pain increased to 5/10 after TM Prone lying x 2 min POE 3 x 30" Prone press up x 5 with 5" hold on last rep Prone hip extension with pillow under hips/abdomen x 10 each Quadruped cat/cow 10 x 5" Quadruped hip extension x 10 - cues to keep back flat Supine HS stretch with strap 3 x 30" bil Hooklying KTOS piriformis stretch 3 x 30" bil Side-sitting hip hinge piriformis stretch 3 x 30" bil    06/18/23 Recumbent bike L2x65min Prone knee flexion x 10 BLE Prone on elbows 2 x 30 sec Prone press up x 5  Supine LTR both way x  10  Supine figure 4 and KTOS stretch x 30 sec B  STM to R glutes, TPR in R piriformis   06/12/23 See pt ed and HEP  If treatment provided  at initial evaluation, no treatment charged due to lack of authorization.    PATIENT EDUCATION:  Education details: HEP update - HS & piriformis stretches   Person educated: Patient and Child(ren) Education method: Explanation, Demonstration, and Handouts Education comprehension: verbalized understanding and returned demonstration  HOME EXERCISE PROGRAM: Access Code: [email or web address] URL: [email or web address] Date: 07/01/2023 Prepared by: Felecia Hopper  Exercises - Lying Prone  - 3-4 x daily - 7 x weekly - 1 sets - 1 reps - 5 min hold - Seated Slump Nerve Glide  - 2 x daily - 7 x weekly - 1-2 sets - 10 reps - 3 sec hold - Prone Press Up On Elbows  - 1 x daily - 7 x weekly - 3 sets - 30 seonds to 1 min hold - Prone Knee Flexion  - 2 x daily - 7 x weekly - 2 sets - 10 reps - Hooklying Hamstring Stretch with Strap  - 2 x daily - 7 x weekly - 3 reps - 30 sec hold - Seated Piriformis Stretch with Trunk Bend  - 2 x daily - 7 x weekly - 3 reps - 30 sec hold   ASSESSMENT:  CLINICAL IMPRESSION: Randall Washington reports his pain has been improving with no pain upon arrival but increased pain noted after warm-up on TM.  Pain continues to be alleviated with prone lying but mixed response to extension based exercises with some temp increased pain reported during exercises as well as feeling of tightness in posterior LEs.  Introduced stretches to address reported tightness with HEP updated accordingly.  Prospero will benefit from continued skilled PT to address ongoing deficits to improve mobility and activity tolerance with decreased pain interference.   OBJECTIVE IMPAIRMENTS: Abnormal gait, decreased activity tolerance, decreased strength, increased muscle spasms, impaired flexibility, improper body mechanics, postural dysfunction, and pain.   ACTIVITY LIMITATIONS: carrying, lifting, bending, sitting, and sleeping  PARTICIPATION LIMITATIONS: driving and occupation  PERSONAL FACTORS: 1 comorbidity:  DM  are also affecting patient's functional outcome.   REHAB POTENTIAL: Good  CLINICAL DECISION MAKING: Evolving/moderate complexity  EVALUATION COMPLEXITY: Moderate   GOALS: Goals reviewed with patient? Yes  SHORT TERM GOALS: Target date: 07/08/2023   Patient will be independent with initial HEP.  Baseline: no HEP Goal status: IN PROGRESS  2.  Patient will report centralization of radicular symptoms.  Baseline: to R toes Goal status: IN PROGRESS   LONG TERM GOALS: Target date: 08/12/2023   Patient will be independent with advanced/ongoing HEP to improve outcomes and carryover.  Baseline: initial HEP Goal status: IN PROGRESS  2.  Patient will report 75% improvement in low back pain to improve QOL.  Baseline: 7/10 Goal status: IN PROGRESS  3.  Patient will report 10 on Modified Oswestry to demonstrate improved functional ability.  Baseline: 16 / 50 = 32.0 % Goal status: IN PROGRESS   4.  Patient will report increased tolerance with sitting to one hour to improve QOL. Baseline: <30 min Goal status: IN PROGRESS  5.  Patient to demonstrate improved gait pattern demonstrating improved functional strength of gluteus med and ant tibialis. Baseline: mild Trendelenberg and decreased DF Goal status: IN PROGRESS   PLAN:  PT FREQUENCY: 2x/week  PT DURATION: other: 12 visits over 8 weeks  PLANNED INTERVENTIONS: 97164- PT Re-evaluation, 97110-Therapeutic exercises, 97530-  Therapeutic activity, [id number]- Neuromuscular re-education, [id number]- Self Care, 16109- Manual therapy, [id number]- Gait training, [id number]- Aquatic Therapy, 97014- Electrical stimulation (unattended), [phone]- Traction (mechanical), Patient/Family education, Taping, Dry Needling, Joint mobilization, Spinal mobilization, Cryotherapy, and Moist heat.  PLAN FOR NEXT SESSION: focus on lumbar stab and core, nerve glides, try extension protocol unless worsens sx, gluteus med and ant tib strength to improve gait, manual/DN prn,  body mechanics and ADL modifications. Possible traction but cannot bill for it.   Randall Washington, PT   07/01/2023, 12:34 PM   PHYSICAL THERAPY DISCHARGE SUMMARY  Visits from Start of Care: 3  Current functional level related to goals / functional outcomes: Refer to above clinical impression and goal assessment for status as of last visit on 07/01/23. Patient failed to return to PT in >30 days, therefore will proceed with discharge from PT for this episode.     Remaining deficits: As above. Unable to formally assess status at discharge due to failure to return to PT.    Education / Equipment: HEP   Patient agrees to discharge. Patient goals were not met. Patient is being discharged due to not returning since the last visit.  Randall Washington, PT 11/21/2023, 9:22 AM  [facility] [address]  [address] [place], [place], [address] Phone: [phone]   Fax:  [phone]

## 2023-07-04 ENCOUNTER — Ambulatory Visit: Payer: Medicaid Other | Admitting: Physical Therapy

## 2023-07-09 ENCOUNTER — Other Ambulatory Visit: Payer: Self-pay | Admitting: Cardiology

## 2023-08-31 IMAGING — CT CT ABD-PELV W/ CM
2 of 5 series · 16 of 46 positions shown, 18 images · IV contrast (agent unspecified)
Comparison: 09/01/2021

CLINICAL DATA: Right lower quadrant pain for 1 week, initial
encounter

EXAM:
CT ABDOMEN AND PELVIS WITH CONTRAST
TECHNIQUE: Multidetector CT imaging of the abdomen and pelvis was performed
using the standard protocol following bolus administration of
intravenous contrast.

[Series 2: axial st · axial · 0.86mm/px · z∈[-558,-118]mm · 13 of 100 slices shown, 15 images]
[im 6/100  soft-tissue]
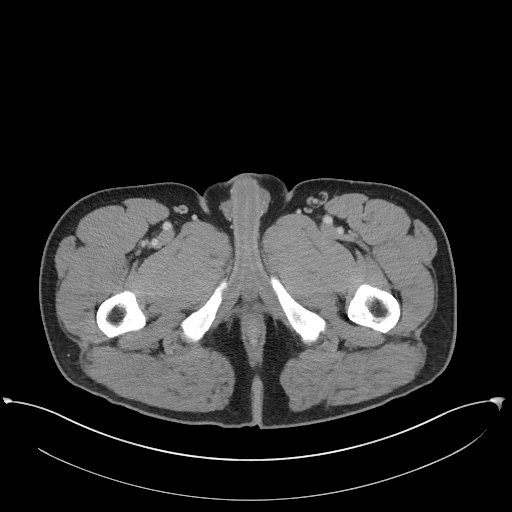
[im 6/100  bone]
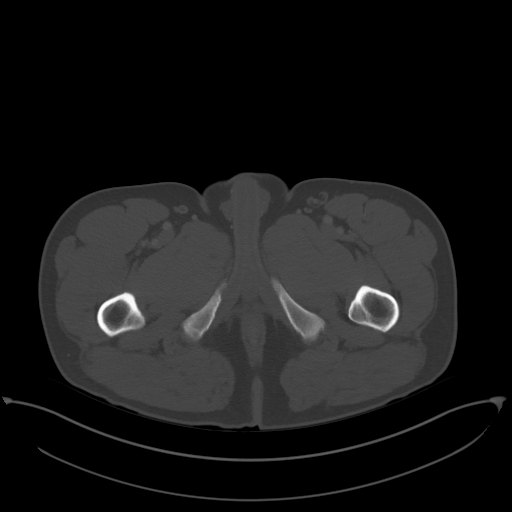
[im 12/100  soft-tissue]
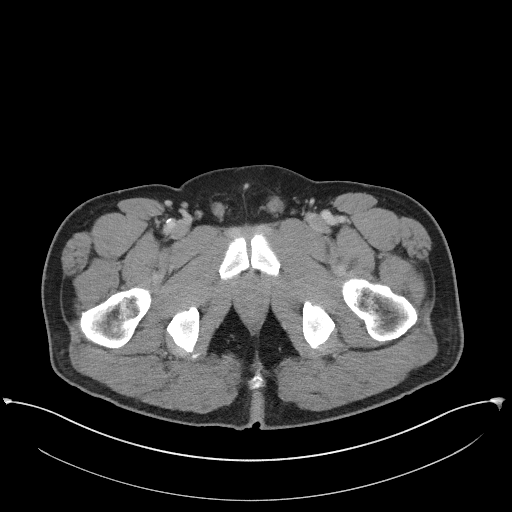
[im 23/100  soft-tissue]
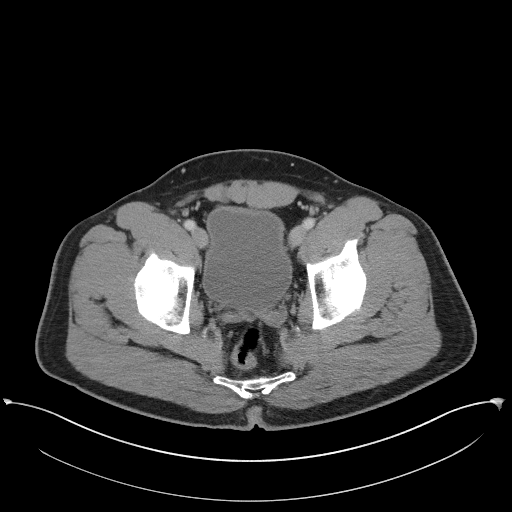
[im 28/100  soft-tissue]
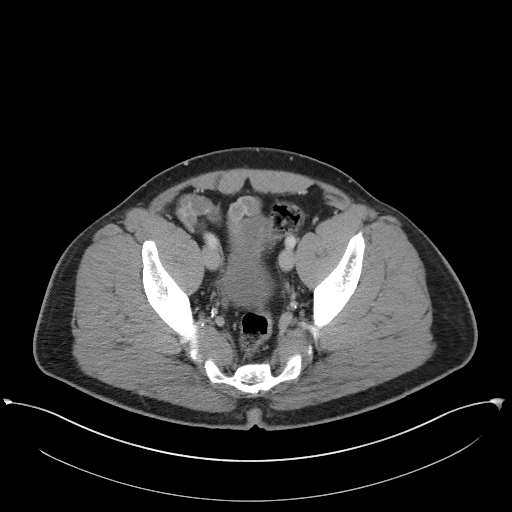
[im 34/100  soft-tissue]
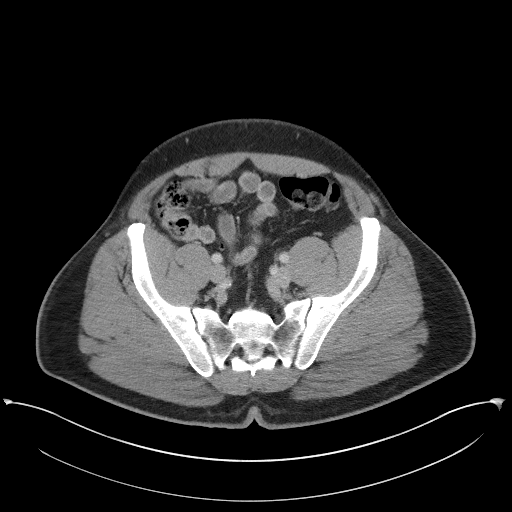
[im 45/100  soft-tissue]
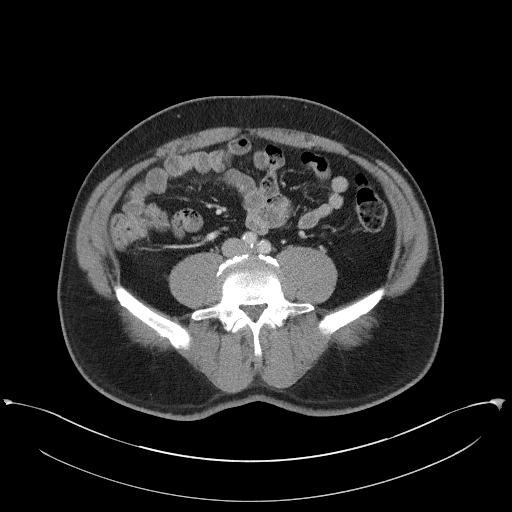
[im 50/100  soft-tissue]
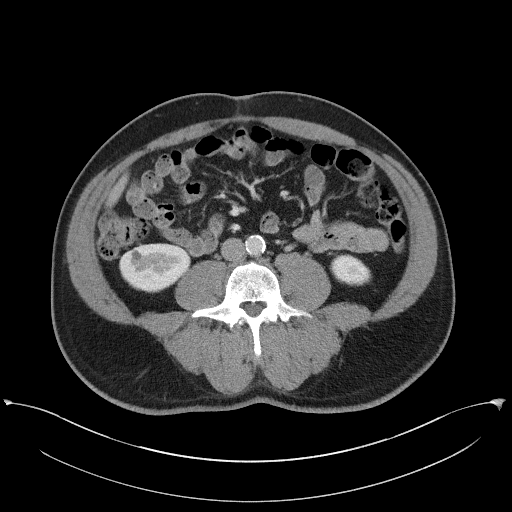
[im 56/100  soft-tissue]
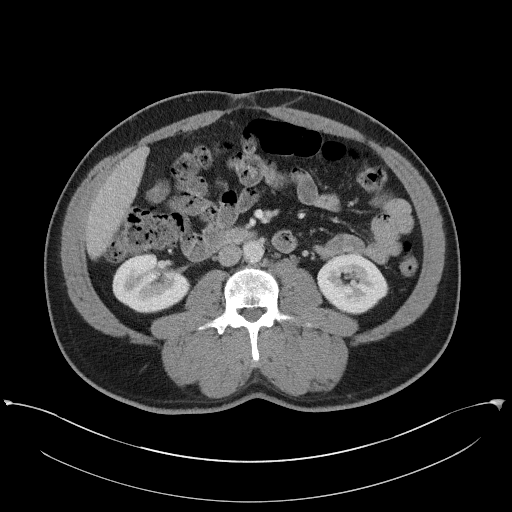
[im 67/100  soft-tissue]
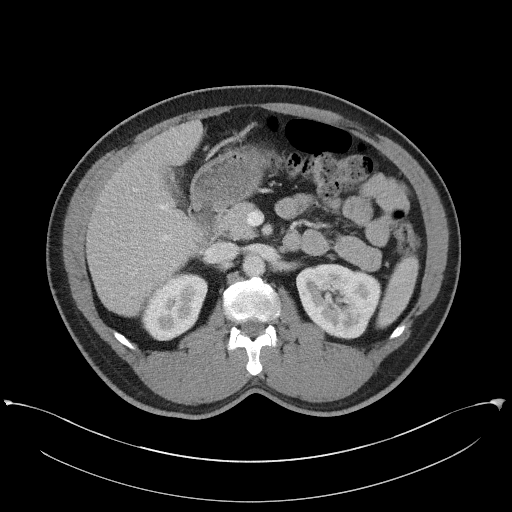
[im 67/100  bone]
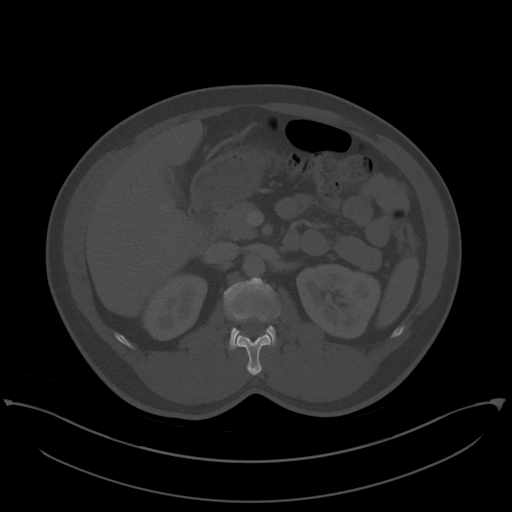
[im 72/100  soft-tissue]
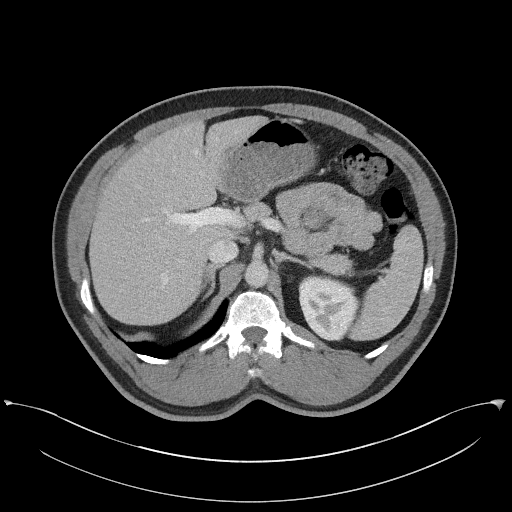
[im 78/100  soft-tissue]
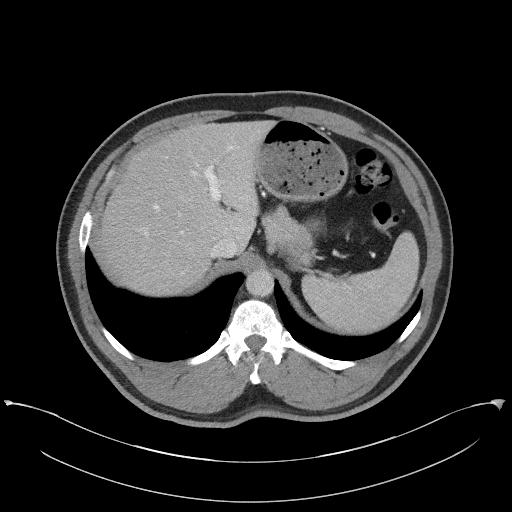
[im 89/100  soft-tissue]
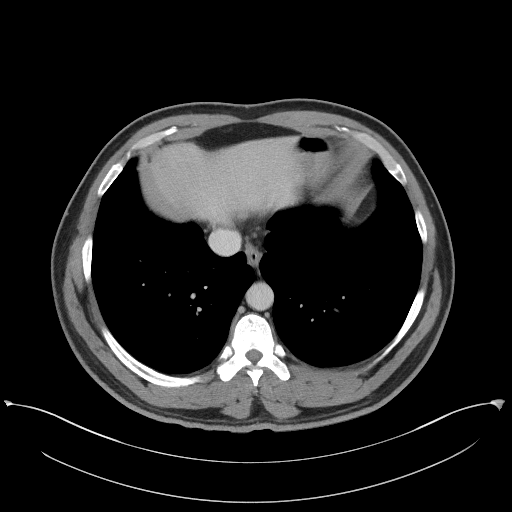
[im 94/100  soft-tissue]
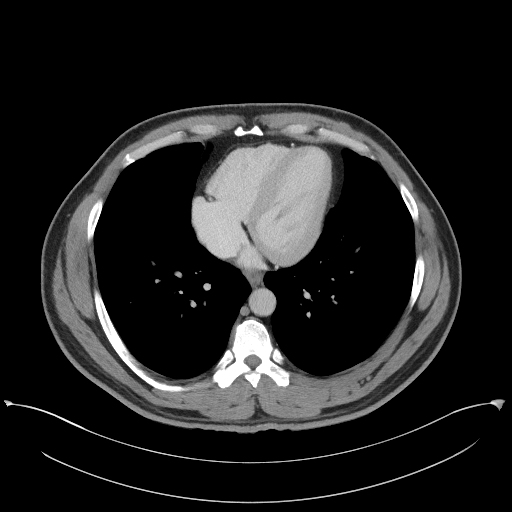

[Series 5: coronal st · coronal · 0.76mm/px · 3 of 101 slices shown]
[im 34/101  soft-tissue]
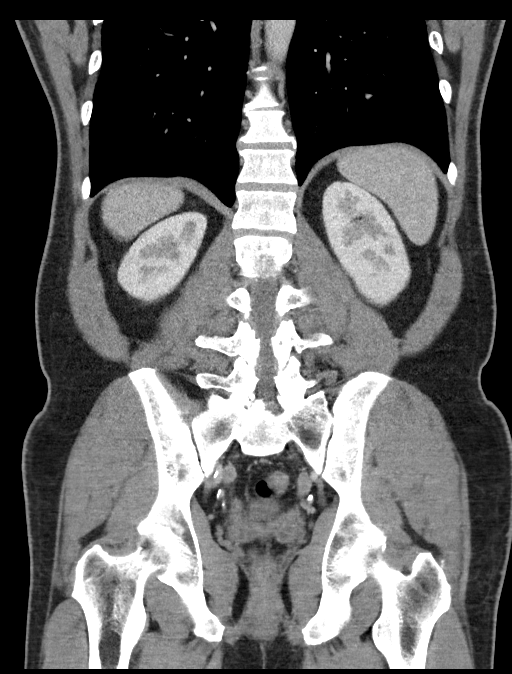
[im 45/101  soft-tissue]
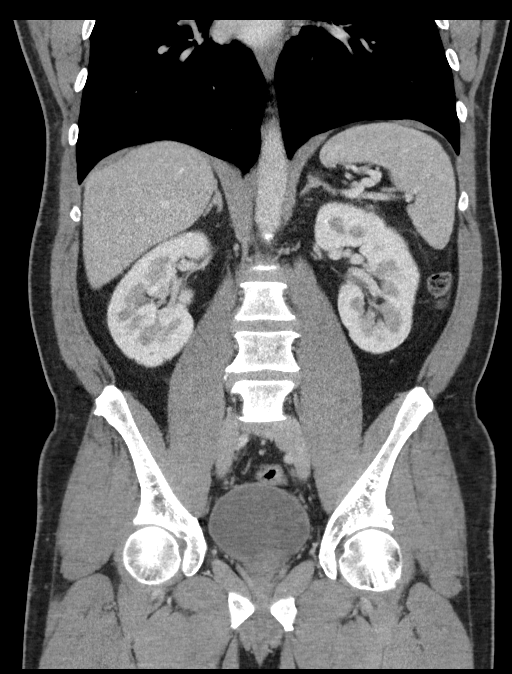
[im 56/101  soft-tissue]
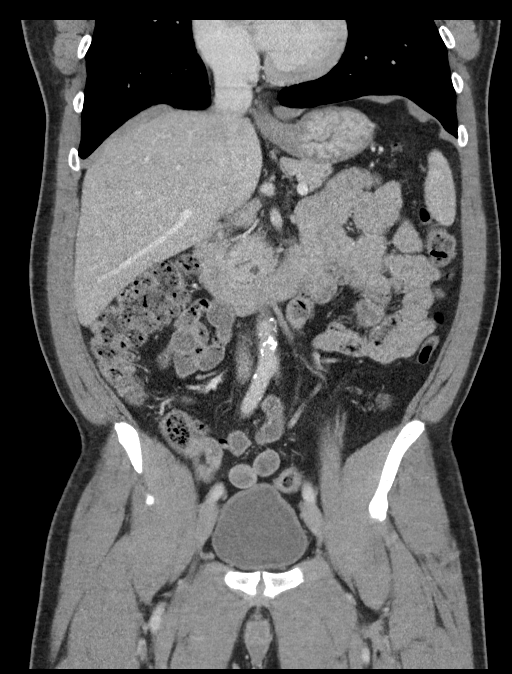

[16 of 46 positions shown; findings below may reference images not displayed]

RADIATION DOSE REDUCTION: This exam was performed according to the
departmental dose-optimization program which includes automated
exposure control, adjustment of the mA and/or kV according to
patient size and/or use of iterative reconstruction technique.

CONTRAST:  100mL OMNIPAQUE IOHEXOL 300 MG/ML  SOLN
FINDINGS: Lower chest: No acute abnormality.

Hepatobiliary: No focal liver abnormality is seen. No gallstones,
gallbladder wall thickening, or biliary dilatation.

Pancreas: Unremarkable. No pancreatic ductal dilatation or
surrounding inflammatory changes.

Spleen: Normal in size without focal abnormality.

Adrenals/Urinary Tract: Adrenal glands are within normal limits
bilaterally. Kidneys demonstrate a normal enhancement pattern
bilaterally. No renal calculi or obstructive changes are seen. A few
small subcentimeter cysts are noted. No follow-up is recommended. No
obstructive changes are seen. The bladder is partially distended.

Stomach/Bowel: No obstructive or inflammatory changes of the colon
are noted. The appendix is air-filled and within normal limits. No
small bowel obstructive changes are seen. The stomach is
unremarkable.

Vascular/Lymphatic: Aortic atherosclerosis. No enlarged abdominal or
pelvic lymph nodes.

Reproductive: Prostate is unremarkable.

Other: No abdominal wall hernia or abnormality. No abdominopelvic
ascites.

Musculoskeletal: No acute or significant osseous findings.
IMPRESSION: No acute abnormality is noted correspond with the given clinical
history.

## 2024-06-16 ENCOUNTER — Other Ambulatory Visit: Payer: Self-pay | Admitting: Cardiology

## 2024-06-22 NOTE — Telephone Encounter (Signed)
 Please send 90-day prescription with 1 refill and arrange nonurgent follow-up appointment with me.  Thanks MJP

## 2024-06-22 NOTE — Telephone Encounter (Signed)
 Pt of Dr. Elmira. Last O/V that patient came to was 03/30/22. This RX was refilled in 06/2023 with a second attempt. Does Dr. Elmira want to refill this RX? Please advise.
